# Patient Record
Sex: Female | Born: 1935 | Race: White | Hispanic: No | Marital: Married | Smoking: Never smoker
Health system: Southern US, Community
[De-identification: ages and names within clinical notes are randomized; demographics above are authoritative.]

## PROBLEM LIST (undated history)

## (undated) DIAGNOSIS — E119 Type 2 diabetes mellitus without complications: Secondary | ICD-10-CM

## (undated) DIAGNOSIS — I2510 Atherosclerotic heart disease of native coronary artery without angina pectoris: Secondary | ICD-10-CM

## (undated) DIAGNOSIS — F0390 Unspecified dementia without behavioral disturbance: Secondary | ICD-10-CM

## (undated) DIAGNOSIS — I10 Essential (primary) hypertension: Secondary | ICD-10-CM

## (undated) DIAGNOSIS — I6529 Occlusion and stenosis of unspecified carotid artery: Secondary | ICD-10-CM

## (undated) DIAGNOSIS — E785 Hyperlipidemia, unspecified: Secondary | ICD-10-CM

## (undated) HISTORY — PX: APPENDECTOMY: SHX54

## (undated) HISTORY — PX: CHOLECYSTECTOMY: SHX55

## (undated) HISTORY — DX: Type 2 diabetes mellitus without complications: E11.9

## (undated) HISTORY — DX: Hyperlipidemia, unspecified: E78.5

## (undated) HISTORY — PX: OTHER SURGICAL HISTORY: SHX169

## (undated) HISTORY — DX: Occlusion and stenosis of unspecified carotid artery: I65.29

## (undated) HISTORY — DX: Atherosclerotic heart disease of native coronary artery without angina pectoris: I25.10

## (undated) HISTORY — DX: Essential (primary) hypertension: I10

---

## 2003-12-29 ENCOUNTER — Inpatient Hospital Stay (HOSPITAL_BASED_OUTPATIENT_CLINIC_OR_DEPARTMENT_OTHER): Admission: RE | Admit: 2003-12-29 | Discharge: 2003-12-29 | Payer: Self-pay | Admitting: *Deleted

## 2011-12-30 ENCOUNTER — Other Ambulatory Visit: Payer: Self-pay | Admitting: Internal Medicine

## 2011-12-31 DIAGNOSIS — R55 Syncope and collapse: Secondary | ICD-10-CM

## 2012-01-07 ENCOUNTER — Telehealth: Payer: Self-pay | Admitting: Cardiology

## 2012-01-07 ENCOUNTER — Other Ambulatory Visit: Payer: Self-pay | Admitting: *Deleted

## 2012-01-07 DIAGNOSIS — R55 Syncope and collapse: Secondary | ICD-10-CM

## 2012-01-07 NOTE — Telephone Encounter (Signed)
Called patient to verify address/insurance information.

## 2012-01-07 NOTE — Telephone Encounter (Signed)
Spoke with patient and she was just recently at [facility] for syncope.  Per patient and follow up appointment patient needs event monitor.  Called the [place] office and they will arrange for monitor to be sent to patient, will forward to Carlye Grippe LPN for this to be done. Will also forward to Dr Antoine Poche and Elita Quick RN for review.  Advised patient if she doesn't receive monitor within the next few days or has another episode to call back.

## 2012-01-07 NOTE — Telephone Encounter (Signed)
New msg Pt said she passed out yesterday. She wanted to discuss. She has no chest pain or sob today

## 2012-01-09 DIAGNOSIS — R55 Syncope and collapse: Secondary | ICD-10-CM

## 2012-01-14 ENCOUNTER — Telehealth: Payer: Self-pay | Admitting: *Deleted

## 2012-01-14 MED ORDER — METOPROLOL SUCCINATE ER 25 MG PO TB24: 25.0000 mg | ORAL_TABLET | Freq: Every day | ORAL | Status: DC

## 2012-01-14 NOTE — Telephone Encounter (Signed)
Spoke with patient and informed her of the new medication that will be called in due to elevated HR episodes showing on heart monitor. Patient denies having any symptoms. Nurse advised patient to call office if she develops any symptoms. Patient verbalized understanding of plan.

## 2012-01-16 ENCOUNTER — Encounter: Payer: Self-pay | Admitting: Cardiology

## 2012-01-29 ENCOUNTER — Ambulatory Visit (INDEPENDENT_AMBULATORY_CARE_PROVIDER_SITE_OTHER): Payer: Medicare Other | Admitting: Cardiology

## 2012-01-29 ENCOUNTER — Encounter: Payer: Self-pay | Admitting: Cardiology

## 2012-01-29 VITALS — BP 116/69 | HR 79 | Ht 63.0 in | Wt 150.8 lb

## 2012-01-29 DIAGNOSIS — I6529 Occlusion and stenosis of unspecified carotid artery: Secondary | ICD-10-CM

## 2012-01-29 MED ORDER — RIVAROXABAN 20 MG PO TABS: 20.0000 mg | ORAL_TABLET | Freq: Every day | ORAL | Status: DC

## 2012-01-29 NOTE — Progress Notes (Signed)
HPI The patient presents for followup after a hospitalization for near syncope. At the time of that hospitalization she was found to have some bradycardia with heart rates in the 50s. She did have an echocardiogram which demonstrated normal left ventricular function. There were no valvular abnormalities. Since discharge she has worn an event monitor. This demonstrated paroxysmal atrial flutter/fibrillation. She was started on metoprolol. However, there were no bradycardic events other than some nighttime mild sinus bradycardia and rare episodes of Mobitz type I.  Of note the patient continues to feel palpitations. She says the beta blocker helped and she has few palpitations in the morning but by the afternoon they return. She really describes dizzy episodes. She's not describing orthostasis. She's had a fall where she lost her footing but she's not had any presyncope or syncope. She denies any chest pressure, neck or arm discomfort. She's had no new shortness of breath, PND or orthopnea. She has had no weight gain or edema. Of note she's only been taking her beta blocker once each morning.  Allergies  Allergen Reactions  . Amoxicillin   . Penicillins     Current Outpatient Prescriptions  Medication Sig Dispense Refill  . enalapril (VASOTEC) 20 MG tablet Take 20 mg by mouth daily.      Marland Kitchen gabapentin (NEURONTIN) 300 MG capsule Take 300 mg by mouth 2 (two) times daily.       . hydrochlorothiazide (MICROZIDE) 12.5 MG capsule Take 12.5 mg by mouth daily.      . metFORMIN (GLUCOPHAGE) 500 MG tablet Take 500 mg by mouth 2 (two) times daily with a meal.       . metoprolol tartrate (LOPRESSOR) 25 MG tablet Take 25 mg by mouth 2 (two) times daily.      Marland Kitchen omeprazole (PRILOSEC) 20 MG capsule Take 20 mg by mouth daily.      . pravastatin (PRAVACHOL) 40 MG tablet Take 40 mg by mouth daily.        Past Medical History  Diagnosis Date  . Diabetes mellitus   . HTN (hypertension)   . HLD (hyperlipidemia)    . CAD (coronary artery disease)     nonobsturctive. Less than 50% bilateral ICA stenosis, 2008.    Past Surgical History  Procedure Date  . Appendectomy   . Cholecystectomy   . Partial hysterectomy --- unknown     ROS:  As stated in the HPI and negative for all other systems.  PHYSICAL EXAM BP 116/69  Pulse 79  Ht 5\' 3"  (1.6 m)  Wt 150 lb 12.8 oz (68.402 kg)  BMI 26.71 kg/m2 GENERAL:  Well appearing HEENT:  Pupils equal round and reactive, fundi not visualized, oral mucosa unremarkable, dentures NECK:  No jugular venous distention, waveform within normal limits, carotid upstroke brisk and symmetric, no bruits, no thyromegaly LYMPHATICS:  No cervical, inguinal adenopathy LUNGS:  Clear to auscultation bilaterally BACK:  No CVA tenderness CHEST:  Unremarkable HEART:  PMI not displaced or sustained,S1 and S2 within normal limits, no S3, no S4, no clicks, no rubs, no murmurs ABD:  Flat, positive bowel sounds normal in frequency in pitch, no bruits, no rebound, no guarding, no midline pulsatile mass, no hepatomegaly, no splenomegaly EXT:  2 plus pulses throughout, no edema, no cyanosis no clubbing SKIN:  No rashes no nodules NEURO:  Cranial nerves II through XII grossly intact, motor grossly intact throughout PSYCH:  Cognitively intact, oriented to person place and time  ASSESSMENT AND PLAN  Atrial flutter/fibrillation -  I have carefully reviewed her rhythm strips and agree that there is evidence for flutter and fibrillation.  Ms. IOLANI TWILLEY has a CHA2DS2 - VASc score of 6 with a risk of stroke of 9.8%.  Given his anticoagulation is indicated. She would be a reasonable candidate for Xarelto.  We discussed at length risk of bleeding.  Dizziness This may be related to the arrhythmia as above. If I find that she's having symptoms it might be related to tachybradycardia syndrome I might switch to pindolol. For now am going to tell her to take her metoprolol twice a day as was  prescribed.  Carotid stenosis This is mild and will be followed up next year with carotid Dopplers.

## 2012-01-29 NOTE — Patient Instructions (Addendum)
   Begin Xarelto 20mg  every evening with meal  Stop monitor Follow up in  2 months

## 2012-02-01 ENCOUNTER — Telehealth: Payer: Self-pay | Admitting: *Deleted

## 2012-02-01 NOTE — Telephone Encounter (Signed)
OK to switch to warfarin.  Please clarify in the chart if this is going to be followed by her primary provider.  If not arrange follow up in our warfarin clinic.  If in primary care office arrange follow up in that office.

## 2012-02-01 NOTE — Telephone Encounter (Signed)
Message left on voice mail - wants to change to coumadin   Returned call - patient states that she would like to switch to Coumadin as the Xarelto is too expensive.  Explained to her about monitoring blood levels (INR) & establishing here in our coumadin clinic.  States she does have OV with [facility]D Virgina Organ) on Monday, 8/12 & will also discuss with him.  Patient does have a $30.00 co-pay here, but may be cheaper at [facility].  Informed patient will forward this info to Dr. Antoine Poche.  She verbalized understanding.

## 2012-02-06 ENCOUNTER — Other Ambulatory Visit: Payer: Self-pay | Admitting: *Deleted

## 2012-02-06 MED ORDER — METOPROLOL TARTRATE 25 MG PO TABS: 25.0000 mg | ORAL_TABLET | Freq: Two times a day (BID) | ORAL | Status: DC

## 2012-02-06 NOTE — Telephone Encounter (Signed)
Spoke with patient this morning and she states that Dr. Marilu Favre wants her to start the xarelto and she didn't need coumadin rx now and was going to pick up the xarelto.

## 2012-02-13 ENCOUNTER — Telehealth: Payer: Self-pay | Admitting: *Deleted

## 2012-02-13 NOTE — Telephone Encounter (Signed)
Patient called to say that xarelto caused her severe diarrhea and took her last dose on this past Sunday 02/10/12. Patient saw Dr. Virgina Organ on Monday and he started her on Coumadin 5 mg daily. Patient also stated that she has been shaking a lot and did inform PCP about this. Nurse advised her if the shaking continued, she should contact PCP back about this. Patient verbalized understanding.

## 2012-03-31 ENCOUNTER — Ambulatory Visit: Payer: Medicare Other | Admitting: Cardiology

## 2012-06-23 ENCOUNTER — Ambulatory Visit: Payer: Medicare Other | Admitting: Cardiology

## 2015-08-08 DIAGNOSIS — E119 Type 2 diabetes mellitus without complications: Secondary | ICD-10-CM | POA: Diagnosis not present

## 2015-10-17 DIAGNOSIS — I10 Essential (primary) hypertension: Secondary | ICD-10-CM | POA: Diagnosis not present

## 2015-10-17 DIAGNOSIS — R9431 Abnormal electrocardiogram [ECG] [EKG]: Secondary | ICD-10-CM | POA: Diagnosis not present

## 2015-10-27 DIAGNOSIS — E119 Type 2 diabetes mellitus without complications: Secondary | ICD-10-CM | POA: Diagnosis not present

## 2015-11-23 DIAGNOSIS — H538 Other visual disturbances: Secondary | ICD-10-CM | POA: Diagnosis not present

## 2015-11-23 DIAGNOSIS — H2511 Age-related nuclear cataract, right eye: Secondary | ICD-10-CM | POA: Diagnosis not present

## 2015-12-19 DIAGNOSIS — H2511 Age-related nuclear cataract, right eye: Secondary | ICD-10-CM | POA: Diagnosis not present

## 2015-12-19 DIAGNOSIS — I10 Essential (primary) hypertension: Secondary | ICD-10-CM | POA: Diagnosis not present

## 2015-12-19 DIAGNOSIS — H348122 Central retinal vein occlusion, left eye, stable: Secondary | ICD-10-CM | POA: Diagnosis not present

## 2015-12-19 DIAGNOSIS — Z961 Presence of intraocular lens: Secondary | ICD-10-CM | POA: Diagnosis not present

## 2015-12-19 DIAGNOSIS — E119 Type 2 diabetes mellitus without complications: Secondary | ICD-10-CM | POA: Diagnosis not present

## 2015-12-19 DIAGNOSIS — H269 Unspecified cataract: Secondary | ICD-10-CM | POA: Diagnosis not present

## 2015-12-19 DIAGNOSIS — Z79899 Other long term (current) drug therapy: Secondary | ICD-10-CM | POA: Diagnosis not present

## 2015-12-19 DIAGNOSIS — H538 Other visual disturbances: Secondary | ICD-10-CM | POA: Diagnosis not present

## 2015-12-19 DIAGNOSIS — H524 Presbyopia: Secondary | ICD-10-CM | POA: Diagnosis not present

## 2015-12-19 DIAGNOSIS — H5200 Hypermetropia, unspecified eye: Secondary | ICD-10-CM | POA: Diagnosis not present

## 2015-12-19 DIAGNOSIS — Z7984 Long term (current) use of oral hypoglycemic drugs: Secondary | ICD-10-CM | POA: Diagnosis not present

## 2015-12-19 DIAGNOSIS — H52209 Unspecified astigmatism, unspecified eye: Secondary | ICD-10-CM | POA: Diagnosis not present

## 2016-01-11 ENCOUNTER — Encounter: Payer: Self-pay | Admitting: Physician Assistant

## 2016-01-11 DIAGNOSIS — I10 Essential (primary) hypertension: Secondary | ICD-10-CM | POA: Diagnosis not present

## 2016-01-11 DIAGNOSIS — E7800 Pure hypercholesterolemia, unspecified: Secondary | ICD-10-CM | POA: Diagnosis not present

## 2016-01-11 DIAGNOSIS — E782 Mixed hyperlipidemia: Secondary | ICD-10-CM | POA: Diagnosis not present

## 2016-01-11 DIAGNOSIS — E119 Type 2 diabetes mellitus without complications: Secondary | ICD-10-CM | POA: Diagnosis not present

## 2016-01-11 DIAGNOSIS — E039 Hypothyroidism, unspecified: Secondary | ICD-10-CM | POA: Diagnosis not present

## 2016-03-06 DIAGNOSIS — E119 Type 2 diabetes mellitus without complications: Secondary | ICD-10-CM | POA: Diagnosis not present

## 2016-03-28 DIAGNOSIS — Z961 Presence of intraocular lens: Secondary | ICD-10-CM | POA: Diagnosis not present

## 2016-04-16 DIAGNOSIS — I10 Essential (primary) hypertension: Secondary | ICD-10-CM | POA: Diagnosis not present

## 2016-04-16 DIAGNOSIS — E119 Type 2 diabetes mellitus without complications: Secondary | ICD-10-CM | POA: Diagnosis not present

## 2016-07-03 DIAGNOSIS — E119 Type 2 diabetes mellitus without complications: Secondary | ICD-10-CM | POA: Diagnosis not present

## 2016-07-11 ENCOUNTER — Encounter: Payer: Self-pay | Admitting: Physician Assistant

## 2016-07-17 ENCOUNTER — Ambulatory Visit: Payer: Self-pay | Admitting: Physician Assistant

## 2016-07-18 ENCOUNTER — Ambulatory Visit (INDEPENDENT_AMBULATORY_CARE_PROVIDER_SITE_OTHER): Payer: Medicare Other | Admitting: Physician Assistant

## 2016-07-18 ENCOUNTER — Encounter: Payer: Self-pay | Admitting: Physician Assistant

## 2016-07-18 VITALS — BP 120/63 | HR 60 | Temp 96.9°F | Ht 63.0 in | Wt 138.8 lb

## 2016-07-18 DIAGNOSIS — I10 Essential (primary) hypertension: Secondary | ICD-10-CM

## 2016-07-18 DIAGNOSIS — E119 Type 2 diabetes mellitus without complications: Secondary | ICD-10-CM | POA: Diagnosis not present

## 2016-07-18 DIAGNOSIS — N3281 Overactive bladder: Secondary | ICD-10-CM | POA: Diagnosis not present

## 2016-07-18 DIAGNOSIS — E7800 Pure hypercholesterolemia, unspecified: Secondary | ICD-10-CM | POA: Diagnosis not present

## 2016-07-18 DIAGNOSIS — G629 Polyneuropathy, unspecified: Secondary | ICD-10-CM

## 2016-07-18 DIAGNOSIS — Z8679 Personal history of other diseases of the circulatory system: Secondary | ICD-10-CM

## 2016-07-18 DIAGNOSIS — E559 Vitamin D deficiency, unspecified: Secondary | ICD-10-CM

## 2016-07-18 LAB — BAYER DCA HB A1C WAIVED: HB A1C (BAYER DCA - WAIVED): 7.1 % — ABNORMAL HIGH (ref <7.0)

## 2016-07-18 MED ORDER — ATORVASTATIN CALCIUM 10 MG PO TABS: 10.0000 mg | ORAL_TABLET | Freq: Every day | ORAL | 3 refills | Status: DC

## 2016-07-18 MED ORDER — DILTIAZEM HCL 30 MG PO TABS: 30.0000 mg | ORAL_TABLET | Freq: Two times a day (BID) | ORAL | 3 refills | Status: DC

## 2016-07-18 MED ORDER — VITAMIN D (ERGOCALCIFEROL) 1.25 MG (50000 UNIT) PO CAPS: 50000.0000 [IU] | ORAL_CAPSULE | ORAL | 3 refills | Status: DC

## 2016-07-18 MED ORDER — METFORMIN HCL 500 MG PO TABS: 500.0000 mg | ORAL_TABLET | Freq: Two times a day (BID) | ORAL | 3 refills | Status: DC

## 2016-07-18 MED ORDER — GABAPENTIN 300 MG PO CAPS: 300.0000 mg | ORAL_CAPSULE | Freq: Every day | ORAL | 3 refills | Status: DC

## 2016-07-18 MED ORDER — ENALAPRIL MALEATE 20 MG PO TABS: 20.0000 mg | ORAL_TABLET | Freq: Two times a day (BID) | ORAL | 3 refills | Status: DC

## 2016-07-18 MED ORDER — OXYBUTYNIN CHLORIDE ER 5 MG PO TB24
5.0000 mg | ORAL_TABLET | Freq: Every day | ORAL | 1 refills | Status: DC
Start: 2016-07-18 — End: 2016-07-19

## 2016-07-18 NOTE — Patient Instructions (Signed)
Overactive Bladder, Adult Introduction Overactive bladder is a group of urinary symptoms. With overactive bladder, you may suddenly feel the need to pass urine (urinate) right away. After feeling this sudden urge, you might also leak urine if you cannot get to the bathroom fast enough (urinary incontinence). These symptoms might interfere with your daily work or social activities. Overactive bladder symptoms may also wake you up at night. Overactive bladder affects the nerve signals between your bladder and your brain. Your bladder may get the signal to empty before it is full. Very sensitive muscles can also make your bladder squeeze too soon. What are the causes? Many things can cause an overactive bladder. Possible causes include:  Urinary tract infection.  Infection of nearby tissues, such as the prostate.  Prostate enlargement.  Being pregnant with twins or more (multiples).  Surgery on the uterus or urethra.  Bladder stones, inflammation, or tumors.  Drinking too much caffeine or alcohol.  Certain medicines, especially those that you take to help your body get rid of extra fluid (diuretics) by increasing urine production.  Muscle or nerve weakness, especially from:  A spinal cord injury.  Stroke.  Multiple sclerosis.  Parkinson disease.  Diabetes. This can cause a high urine volume that fills the bladder so quickly that the normal urge to urinate is triggered very strongly.  Constipation. A buildup of too much stool can put pressure on your bladder. What increases the risk? You may be at greater risk for overactive bladder if you:  Are an older adult.  Smoke.  Are going through menopause.  Have prostate problems.  Have a neurological disease, such as stroke, dementia, Parkinson disease, or multiple sclerosis (MS).  Eat or drink things that irritate the bladder. These include alcohol, spicy food, and caffeine.  Are overweight or obese. What are the signs or  symptoms? The signs and symptoms of an overactive bladder include:  Sudden, strong urges to urinate.  Leaking urine.  Urinating eight or more times per day.  Waking up to urinate two or more times per night. How is this diagnosed? Your health care provider may suspect overactive bladder based on your symptoms. The health care provider will do a physical exam and take your medical history. Blood or urine tests may also be done. For example, you might need to have a bladder function test to check how well you can hold your urine. You might also need to see a health care provider who specializes in the urinary tract (urologist). How is this treated? Treatment for overactive bladder depends on the cause of your condition and whether it is mild or severe. Certain treatments can be done in your health care provider's office or clinic. You can also make lifestyle changes at home. Options include: Behavioral Treatments  Biofeedback. A specialist uses sensors to help you become aware of your body's signals.  Keeping a daily log of when you need to urinate and what happens after the urge. This may help you manage your condition.  Bladder training. This helps you learn to control the urge to urinate by following a schedule that directs you to urinate at regular intervals (timed voiding). At first, you might have to wait a few minutes after feeling the urge. In time, you should be able to schedule bathroom visits an hour or more apart.  Kegel exercises. These are exercises to strengthen the pelvic floor muscles, which support the bladder. Toning these muscles can help you control urination, even if your bladder muscles   are overactive. A specialist will teach you how to do these exercises correctly. They require daily practice.  Weight loss. If you are obese or overweight, losing weight might relieve your symptoms of overactive bladder. Talk to your health care provider about losing weight and whether  there is a specific program or method that would work best for you.  Diet change. This might help if constipation is making your overactive bladder worse. Your health care provider or a dietitian can explain ways to change what you eat to ease constipation. You might also need to consume less alcohol and caffeine or drink other fluids at different times of the day.  Stopping smoking.  Wearing pads to absorb leakage while you wait for other treatments to take effect. Physical Treatments  Electrical stimulation. Electrodes send gentle pulses of electricity to strengthen the nerves or muscles that help to control the bladder. Sometimes, the electrodes are placed outside of the body. In other cases, they might be placed inside the body (implanted). This treatment can take several months to have an effect.  Supportive devices. Women may need a plastic device that fits into the vagina and supports the bladder (pessary). Medicines  Several medicines can help treat overactive bladder and are usually used along with other treatments. Some are injected into the muscles involved in urination. Others come in pill form. Your health care provider may prescribe:  Antispasmodics. These medicines block the signals that the nerves send to the bladder. This keeps the bladder from releasing urine at the wrong time.  Tricyclic antidepressants. These types of antidepressants also relax bladder muscles. Surgery  You may have a device implanted to help manage the nerve signals that indicate when you need to urinate.  You may have surgery to implant electrodes for electrical stimulation.  Sometimes, very severe cases of overactive bladder require surgery to change the shape of the bladder. Follow these instructions at home:  Take medicines only as directed by your health care provider.  Use any implants or a pessary as directed by your health care provider.  Make any diet or lifestyle changes that are  recommended by your health care provider. These might include:  Drinking less fluid or drinking at different times of the day. If you need to urinate often during the night, you may need to stop drinking fluids early in the evening.  Cutting down on caffeine or alcohol. Both can make an overactive bladder worse. Caffeine is found in coffee, tea, and sodas.  Doing Kegel exercises to strengthen muscles.  Losing weight if you need to.  Eating a healthy and balanced diet to prevent constipation.  Keep a journal or log to track how much and when you drink and also when you feel the need to urinate. This will help your health care provider to monitor your condition. Contact a health care provider if:  Your symptoms do not get better after treatment.  Your pain and discomfort are getting worse.  You have more frequent urges to urinate.  You have a fever. Get help right away if: You are not able to control your bladder at all. This information is not intended to replace advice given to you by your health care provider. Make sure you discuss any questions you have with your health care provider. Document Released: 04/07/2009 Document Revised: 11/17/2015 Document Reviewed: 11/04/2013  2017 Elsevier  

## 2016-07-19 ENCOUNTER — Telehealth: Payer: Self-pay

## 2016-07-19 LAB — VITAMIN D 25 HYDROXY (VIT D DEFICIENCY, FRACTURES): Vit D, 25-Hydroxy: 51.6 ng/mL (ref 30.0–100.0)

## 2016-07-19 LAB — CMP14+EGFR
ALT: 10 [IU]/L (ref 0–32)
AST: 12 [IU]/L (ref 0–40)
Albumin/Globulin Ratio: 1.9 (ref 1.2–2.2)
Albumin: 4.1 g/dL (ref 3.5–4.7)
Alkaline Phosphatase: 75 [IU]/L (ref 39–117)
BUN/Creatinine Ratio: 26 (ref 12–28)
BUN: 19 mg/dL (ref 8–27)
Bilirubin Total: 0.4 mg/dL (ref 0.0–1.2)
CO2: 24 mmol/L (ref 18–29)
Calcium: 9.1 mg/dL (ref 8.7–10.3)
Chloride: 104 mmol/L (ref 96–106)
Creatinine, Ser: 0.73 mg/dL (ref 0.57–1.00)
GFR calc Af Amer: 89 mL/min/{1.73_m2}
GFR calc non Af Amer: 77 mL/min/{1.73_m2}
Globulin, Total: 2.2 g/dL (ref 1.5–4.5)
Glucose: 88 mg/dL (ref 65–99)
Potassium: 4.1 mmol/L (ref 3.5–5.2)
Sodium: 143 mmol/L (ref 134–144)
Total Protein: 6.3 g/dL (ref 6.0–8.5)

## 2016-07-19 LAB — LIPID PANEL
Chol/HDL Ratio: 3 ratio (ref 0.0–4.4)
Cholesterol, Total: 157 mg/dL (ref 100–199)
HDL: 52 mg/dL
LDL Calculated: 76 mg/dL (ref 0–99)
Triglycerides: 146 mg/dL (ref 0–149)
VLDL Cholesterol Cal: 29 mg/dL (ref 5–40)

## 2016-07-19 MED ORDER — TOLTERODINE TARTRATE ER 2 MG PO CP24: 2.0000 mg | ORAL_CAPSULE | Freq: Every day | ORAL | 2 refills | Status: DC

## 2016-07-19 NOTE — Progress Notes (Signed)
Subjective:    Patient ID: Julie Riley, female    DOB: 04/07/1932, 83 y.o.   MRN: [id number] Patient here to be established as new patient at [facility].   Diabetes  She presents for her initial diabetic visit. She has type 2 diabetes mellitus. No MedicAlert identification noted. The initial diagnosis of diabetes was made 5 years ago. Her disease course has been stable. There are no hypoglycemic associated symptoms. Pertinent negatives for hypoglycemia include no dizziness. Pertinent negatives for diabetes include no blurred vision, no chest pain, no fatigue, no foot ulcerations, no visual change and no weakness. There are no hypoglycemic complications. Symptoms are stable. There are no diabetic complications. Risk factors for coronary artery disease include dyslipidemia and family history. Current diabetic treatment includes diet and oral agent (monotherapy). She is compliant with treatment all of the time. Her weight is stable. She is following a diabetic diet. Her breakfast blood glucose is taken between 7-8 am. Her breakfast blood glucose range is generally 90-110 mg/dl.  Hypertension  This is a chronic problem. The current episode started more than 1 year ago. The problem is unchanged. The problem is controlled. Pertinent negatives include no anxiety, blurred vision, chest pain or peripheral edema. There are no associated agents to hypertension. There are no known risk factors for coronary artery disease. Past treatments include nothing. The current treatment provides significant improvement. There are no compliance problems.  There is no history of angina, kidney disease, CAD/MI or heart failure.  Atrial Fibrillation  Presents for follow-up visit. Symptoms include hypertension. Symptoms are negative for chest pain, dizziness and weakness. The symptoms have been stable. Past medical history includes atrial fibrillation and hyperlipidemia. There are no medication  compliance problems.  Hyperlipidemia  This is a chronic problem. The current episode started more than 1 year ago. The problem is controlled. Recent lipid tests were reviewed and are normal. Exacerbating diseases include diabetes. Pertinent negatives include no chest pain. Current antihyperlipidemic treatment includes statins. The current treatment provides significant improvement of lipids. There are no compliance problems.  Risk factors for coronary artery disease include diabetes mellitus, dyslipidemia and family history.   Past Medical History:  Diagnosis Date  . CAD (coronary artery disease)    nonobsturctive.   . Carotid stenosis    Less than 50% bilateral ICA stenosis, 2008.  . Diabetes mellitus ([place])   . HLD (hyperlipidemia)   . HTN (hypertension)    Family History  Problem Relation Age of Onset  . Heart attack Father     cause of death   Social History   Social History  . Marital status: Married    Spouse name: N/A  . Number of children: N/A  . Years of education: N/A   Social History Main Topics  . Smoking status: Never Smoker  . Smokeless tobacco: Never Used  . Alcohol use No  . Drug use: No  . Sexual activity: Not Asked   Other Topics Concern  . None   Social History Narrative  . None   Past Surgical History:  Procedure Laterality Date  . APPENDECTOMY    . CHOLECYSTECTOMY    . partial hysterectomy --- unknown     No current outpatient prescriptions on file prior to visit.   No current facility-administered medications on file prior to visit.      Review of Systems  Constitutional: Negative.  Negative for activity change, fatigue and fever.  HENT: Negative.   Eyes: Negative.  Negative for  blurred vision.  Respiratory: Negative.  Negative for cough.   Cardiovascular: Negative.  Negative for chest pain.  Gastrointestinal: Negative.  Negative for abdominal pain.  Endocrine: Negative.   Genitourinary: Positive for difficulty urinating and frequency.  Negative for dyspareunia, dysuria, flank pain, hematuria and pelvic pain.  Musculoskeletal: Negative.   Skin: Negative.   Neurological: Negative.  Negative for dizziness and weakness.   BP 120/63   Pulse 60   Temp (!) 96.9 F (36.1 C) (Oral)   Ht _0  (1.6 m)   Wt 138 lb 12.8 oz (63 kg)   BMI 24.59 kg/m      Objective:   Physical Exam  Constitutional: She is oriented to person, place, and time. She appears well-developed and well-nourished.  HENT:  Head: Normocephalic and atraumatic.  Right Ear: Tympanic membrane, external ear and ear canal normal.  Left Ear: Tympanic membrane, external ear and ear canal normal.  Nose: Nose normal. No rhinorrhea.  Mouth/Throat: Oropharynx is clear and moist and mucous membranes are normal. No oropharyngeal exudate or posterior oropharyngeal erythema.  Eyes: Conjunctivae and EOM are normal. Pupils are equal, round, and reactive to light.  Neck: Normal range of motion. Neck supple.  Cardiovascular: Normal rate, regular rhythm, normal heart sounds and intact distal pulses.   Pulmonary/Chest: Effort normal and breath sounds normal.  Abdominal: Soft. Bowel sounds are normal. She exhibits no distension. There is no tenderness.  Neurological: She is alert and oriented to person, place, and time. She has normal reflexes.  Skin: Skin is warm and dry. No rash noted.  Psychiatric: She has a normal mood and affect. Her behavior is normal. Judgment and thought content normal.          Assessment & Plan:  1. Essential hypertension - enalapril (VASOTEC) 20 MG tablet; Take 1 tablet (20 mg total) by mouth 2 (two) times daily.  Dispense: 180 tablet; Refill: 3 - diltiazem (CARDIZEM) 30 MG tablet; Take 1 tablet (30 mg total) by mouth 2 (two) times daily.  Dispense: 180 tablet; Refill: 3  2. History of atrial fibrillation - aspirin 325 MG EC tablet; Take 325 mg by mouth daily. - diltiazem (CARDIZEM) 30 MG tablet; Take 1 tablet (30 mg total) by mouth 2 (two)  times daily.  Dispense: 180 tablet; Refill: 3  3. Type 2 diabetes mellitus without complication, without long-term current use of insulin (HCC) - metFORMIN (GLUCOPHAGE) 500 MG tablet; Take 1 tablet (500 mg total) by mouth 2 (two) times daily with a meal.  Dispense: 180 tablet; Refill: 3 - CMP14+EGFR - POCT glycosylated hemoglobin (Hb A1C) - Lipid panel - Bayer DCA Hb A1c Waived  4. Neuropathy (HCC) - gabapentin (NEURONTIN) 300 MG capsule; Take 1 capsule (300 mg total) by mouth daily.  Dispense: 90 capsule; Refill: 3  5. Vitamin D deficiency - Vitamin D, Ergocalciferol, (DRISDOL) 50000 units CAPS capsule; Take 1 capsule (50,000 Units total) by mouth every 7 (seven) days.  Dispense: 90 capsule; Refill: 3 - VITAMIN D 25 Hydroxy (Vit-D Deficiency, Fractures)  6. OAB (overactive bladder) detrol LA 2 mg one daily  7. Pure hypercholesterolemia - atorvastatin (LIPITOR) 10 MG tablet; Take 1 tablet (10 mg total) by mouth daily.  Dispense: 90 tablet; Refill: 3 - Lipid panel  An After Visit Summary was printed and given to the patient.  Follow up 3 months

## 2016-07-19 NOTE — Telephone Encounter (Signed)
Prescription sent to pharmacy.

## 2016-10-16 ENCOUNTER — Encounter: Payer: Self-pay | Admitting: Physician Assistant

## 2016-10-16 ENCOUNTER — Ambulatory Visit (INDEPENDENT_AMBULATORY_CARE_PROVIDER_SITE_OTHER): Payer: Medicare Other | Admitting: Physician Assistant

## 2016-10-16 VITALS — BP 138/74 | HR 64 | Temp 97.4°F | Ht 63.0 in | Wt 134.2 lb

## 2016-10-16 DIAGNOSIS — N3281 Overactive bladder: Secondary | ICD-10-CM

## 2016-10-16 DIAGNOSIS — E119 Type 2 diabetes mellitus without complications: Secondary | ICD-10-CM

## 2016-10-16 LAB — BAYER DCA HB A1C WAIVED: HB A1C (BAYER DCA - WAIVED): 7.1 % — ABNORMAL HIGH (ref <7.0)

## 2016-10-16 MED ORDER — VITAMIN D-3 125 MCG (5000 UT) PO TABS: 1.0000 | ORAL_TABLET | Freq: Every day | ORAL | Status: DC

## 2016-10-16 MED ORDER — TOLTERODINE TARTRATE ER 4 MG PO CP24: 4.0000 mg | ORAL_CAPSULE | Freq: Every day | ORAL | 3 refills | Status: DC

## 2016-10-17 NOTE — Progress Notes (Signed)
BP 138/74   Pulse 64   Temp 97.4 F (36.3 C) (Oral)   Ht  (1.6 m)   Wt 134 lb 3.2 oz (60.9 kg)   BMI 23.77 kg/m    Subjective:    Patient ID: Julie Riley, female    DOB: 1932/05/03, 83 y.o.   MRN: [id number]  HPI: Julie Riley is a 83 y.o. female presenting on 10/16/2016 for Follow-up (3 month )  This patient comes in for periodic recheck on medications and conditions including Diabetes, overactive bladder, neuralgia, hypertension. Overall the patient is doing well. She states that the Detrol 2 mg has not made a huge difference in her overactive bladder symptoms but has helped somewhat. She is willing to try the 4 mg dose. We will send this to the pharmacy. Her last A1c was 7.1. That is good control for her. It is very likely she will not have hypoglycemic events. We will recheck an A1c today.   All medications are reviewed today. There are no reports of any problems with the medications. All of the medical conditions are reviewed and updated.  Lab work is reviewed and will be ordered as medically necessary. There are no new problems reported with today's visit.   Relevant past medical, surgical, family and social history reviewed and updated as indicated. Allergies and medications reviewed and updated.  Past Medical History:  Diagnosis Date  . CAD (coronary artery disease)    nonobsturctive.   . Carotid stenosis    Less than 50% bilateral ICA stenosis, 2008.  . Diabetes mellitus (HCC)   . HLD (hyperlipidemia)   . HTN (hypertension)     Past Surgical History:  Procedure Laterality Date  . APPENDECTOMY    . CHOLECYSTECTOMY    . partial hysterectomy --- unknown      Review of Systems  Constitutional: Negative.  Negative for activity change, fatigue and fever.  HENT: Negative.   Eyes: Negative.   Respiratory: Negative.  Negative for cough.   Cardiovascular: Negative.  Negative for chest pain.  Gastrointestinal: Negative.  Negative for abdominal pain.    Endocrine: Negative.   Genitourinary: Positive for frequency. Negative for dysuria, flank pain and genital sores.  Musculoskeletal: Negative.   Skin: Negative.   Neurological: Negative.     Allergies as of 10/16/2016      Reactions   Amoxicillin    Penicillins    Xarelto [rivaroxaban] Other (See Comments)   Severe diarrhea      Medication List       Accurate as of 10/16/16 11:59 PM. Always use your most recent med list.          aspirin 325 MG EC tablet Take 325 mg by mouth daily.   atorvastatin 10 MG tablet Commonly known as:  LIPITOR Take 1 tablet (10 mg total) by mouth daily.   diltiazem 30 MG tablet Commonly known as:  CARDIZEM Take 1 tablet (30 mg total) by mouth 2 (two) times daily.   enalapril 20 MG tablet Commonly known as:  VASOTEC Take 1 tablet (20 mg total) by mouth 2 (two) times daily.   gabapentin 300 MG capsule Commonly known as:  NEURONTIN Take 1 capsule (300 mg total) by mouth daily.   metFORMIN 500 MG tablet Commonly known as:  GLUCOPHAGE Take 1 tablet (500 mg total) by mouth 2 (two) times daily with a meal.   tolterodine 4 MG 24 hr capsule Commonly known as:  DETROL LA Take 1 capsule (4 mg  total) by mouth daily.   Vitamin D-3 5000 units Tabs Take 1 tablet by mouth daily.          Objective:    BP 138/74   Pulse 64   Temp 97.4 F (36.3 C) (Oral)   Ht  (1.6 m)   Wt 134 lb 3.2 oz (60.9 kg)   BMI 23.77 kg/m   Allergies  Allergen Reactions  . Amoxicillin   . Penicillins   . Xarelto [Rivaroxaban] Other (See Comments)    Severe diarrhea    Physical Exam  Constitutional: She is oriented to person, place, and time. She appears well-developed and well-nourished.  HENT:  Head: Normocephalic and atraumatic.  Right Ear: Tympanic membrane, external ear and ear canal normal.  Left Ear: Tympanic membrane, external ear and ear canal normal.  Nose: Nose normal. No rhinorrhea.  Mouth/Throat: Oropharynx is clear and moist and mucous  membranes are normal. No oropharyngeal exudate or posterior oropharyngeal erythema.  Eyes: Conjunctivae and EOM are normal. Pupils are equal, round, and reactive to light.  Neck: Normal range of motion. Neck supple.  Cardiovascular: Normal rate, regular rhythm, normal heart sounds and intact distal pulses.   Pulmonary/Chest: Effort normal and breath sounds normal.  Abdominal: Soft. Bowel sounds are normal.  Neurological: She is alert and oriented to person, place, and time. She has normal reflexes.  Skin: Skin is warm and dry. No rash noted.  Psychiatric: She has a normal mood and affect. Her behavior is normal. Judgment and thought content normal.    Results for orders placed or performed in visit on 10/16/16  Bayer DCA Hb A1c Waived  Result Value Ref Range   Bayer DCA Hb A1c Waived 7.1 (H) <7.0 %      Assessment & Plan:   1. Type 2 diabetes mellitus without complication, without long-term current use of insulin (HCC) - Cholecalciferol (VITAMIN D-3) 5000 units TABS; Take 1 tablet by mouth daily.  Dispense: 30 tablet - Bayer DCA Hb A1c Waived  2. OAB (overactive bladder) - tolterodine (DETROL LA) 4 MG 24 hr capsule; Take 1 capsule (4 mg total) by mouth daily.  Dispense: 90 capsule; Refill: 3   Current Outpatient Prescriptions:  .  aspirin 325 MG EC tablet, Take 325 mg by mouth daily., Disp: , Rfl:  .  atorvastatin (LIPITOR) 10 MG tablet, Take 1 tablet (10 mg total) by mouth daily., Disp: 90 tablet, Rfl: 3 .  diltiazem (CARDIZEM) 30 MG tablet, Take 1 tablet (30 mg total) by mouth 2 (two) times daily., Disp: 180 tablet, Rfl: 3 .  enalapril (VASOTEC) 20 MG tablet, Take 1 tablet (20 mg total) by mouth 2 (two) times daily., Disp: 180 tablet, Rfl: 3 .  gabapentin (NEURONTIN) 300 MG capsule, Take 1 capsule (300 mg total) by mouth daily., Disp: 90 capsule, Rfl: 3 .  metFORMIN (GLUCOPHAGE) 500 MG tablet, Take 1 tablet (500 mg total) by mouth 2 (two) times daily with a meal., Disp: 180 tablet,  Rfl: 3 .  tolterodine (DETROL LA) 4 MG 24 hr capsule, Take 1 capsule (4 mg total) by mouth daily., Disp: 90 capsule, Rfl: 3 .  Cholecalciferol (VITAMIN D-3) 5000 units TABS, Take 1 tablet by mouth daily., Disp: 30 tablet, Rfl:   Continue all other maintenance medications as listed above.  Follow up plan: Return in about 3 months (around 01/15/2017).  Educational handout given for overactive bladder  Remus Loffler PA-C [facility] [address]  [place], [place]  [address] [phone]   10/17/2016, 9:12 AM

## 2016-12-19 ENCOUNTER — Telehealth: Payer: Self-pay | Admitting: Physician Assistant

## 2016-12-19 NOTE — Telephone Encounter (Signed)
rx already there, pt aware

## 2016-12-20 ENCOUNTER — Telehealth: Payer: Self-pay | Admitting: Physician Assistant

## 2016-12-24 NOTE — Telephone Encounter (Signed)
Pt to schedule eye exam

## 2017-01-03 DIAGNOSIS — H40033 Anatomical narrow angle, bilateral: Secondary | ICD-10-CM | POA: Diagnosis not present

## 2017-01-03 DIAGNOSIS — E119 Type 2 diabetes mellitus without complications: Secondary | ICD-10-CM | POA: Diagnosis not present

## 2017-01-15 ENCOUNTER — Ambulatory Visit (INDEPENDENT_AMBULATORY_CARE_PROVIDER_SITE_OTHER): Payer: Medicare Other | Admitting: Physician Assistant

## 2017-01-15 ENCOUNTER — Encounter: Payer: Self-pay | Admitting: Physician Assistant

## 2017-01-15 VITALS — BP 133/81 | HR 67 | Temp 97.2°F | Ht 63.0 in | Wt 136.2 lb

## 2017-01-15 DIAGNOSIS — N3281 Overactive bladder: Secondary | ICD-10-CM

## 2017-01-15 DIAGNOSIS — I10 Essential (primary) hypertension: Secondary | ICD-10-CM

## 2017-01-15 DIAGNOSIS — E119 Type 2 diabetes mellitus without complications: Secondary | ICD-10-CM | POA: Diagnosis not present

## 2017-01-15 NOTE — Patient Instructions (Signed)
In a few days you may receive a survey in the mail or online from [employer] regarding your visit with us today. Please take a moment to fill this out. Your feedback is very important to our whole office. It can help us better understand your needs as well as improve your experience and satisfaction. Thank you for taking your time to complete it. We care about you.  Sally Menard, PA-C  

## 2017-01-15 NOTE — Progress Notes (Signed)
BP 133/81   Pulse 67   Temp (!) 97.2 F (36.2 C) (Oral)   Ht '5\' 3"'  (1.6 m)   Wt 136 lb 3.2 oz (61.8 kg)   BMI 24.13 kg/m    Subjective:    Patient ID: Julie Riley, female    DOB: 11-05-1931, 83 y.o.   MRN: [id number]  HPI: Julie Riley is a 83 y.o. female presenting on 01/15/2017 for Follow-up (3 month ); Hypertension; and Diabetes  This patient comes in for periodic recheck on medications and conditions including type 2 diabetes well controlled, hypertension, overactive bladder. She has lost her daughter in the past couple months related to cancer. She seems good and strong today. There are no new complaints except her right shoulder will feel sore at times. She is able to have good range of motion and not dropping anything. She will just continue to watch this. The medication is helping her overactive bladder tremendously. She is happy to keep taking the Detrol.   All medications are reviewed today. There are no reports of any problems with the medications. All of the medical conditions are reviewed and updated.  Lab work is reviewed and will be ordered as medically necessary. There are no new problems reported with today's visit.   Relevant past medical, surgical, family and social history reviewed and updated as indicated. Allergies and medications reviewed and updated.  Past Medical History:  Diagnosis Date  . CAD (coronary artery disease)    nonobsturctive.   . Carotid stenosis    Less than 50% bilateral ICA stenosis, 2008.  . Diabetes mellitus ([place])   . HLD (hyperlipidemia)   . HTN (hypertension)     Past Surgical History:  Procedure Laterality Date  . APPENDECTOMY    . CHOLECYSTECTOMY    . partial hysterectomy --- unknown      Review of Systems  Constitutional: Negative.  Negative for activity change, fatigue and fever.  HENT: Negative.   Eyes: Negative.   Respiratory: Negative.  Negative for cough.   Cardiovascular: Negative.  Negative for chest pain.    Gastrointestinal: Negative.  Negative for abdominal pain.  Endocrine: Negative.   Genitourinary: Negative.  Negative for dysuria.  Musculoskeletal: Positive for arthralgias.  Skin: Negative.   Neurological: Negative.     Allergies as of 01/15/2017      Reactions   Amoxicillin    Penicillins    Xarelto [rivaroxaban] Other (See Comments)   Severe diarrhea      Medication List       Accurate as of 01/15/17  9:14 AM. Always use your most recent med list.          aspirin 325 MG EC tablet Take 325 mg by mouth daily.   atorvastatin 10 MG tablet Commonly known as:  LIPITOR Take 1 tablet (10 mg total) by mouth daily.   diltiazem 30 MG tablet Commonly known as:  CARDIZEM Take 1 tablet (30 mg total) by mouth 2 (two) times daily.   enalapril 20 MG tablet Commonly known as:  VASOTEC Take 1 tablet (20 mg total) by mouth 2 (two) times daily.   gabapentin 300 MG capsule Commonly known as:  NEURONTIN Take 1 capsule (300 mg total) by mouth daily.   metFORMIN 500 MG tablet Commonly known as:  GLUCOPHAGE Take 1 tablet (500 mg total) by mouth 2 (two) times daily with a meal.   tolterodine 4 MG 24 hr capsule Commonly known as:  DETROL LA Take 1 capsule (4  mg total) by mouth daily.   Vitamin D-3 5000 units Tabs Take 1 tablet by mouth daily.          Objective:    BP 133/81   Pulse 67   Temp (!) 97.2 F (36.2 C) (Oral)   Ht '5\' 3"'  (1.6 m)   Wt 136 lb 3.2 oz (61.8 kg)   BMI 24.13 kg/m   Allergies  Allergen Reactions  . Amoxicillin   . Penicillins   . Xarelto [Rivaroxaban] Other (See Comments)    Severe diarrhea    Physical Exam  Constitutional: She is oriented to person, place, and time. She appears well-developed and well-nourished.  HENT:  Head: Normocephalic and atraumatic.  Right Ear: Tympanic membrane, external ear and ear canal normal.  Left Ear: Tympanic membrane, external ear and ear canal normal.  Nose: Nose normal. No rhinorrhea.  Mouth/Throat:  Oropharynx is clear and moist and mucous membranes are normal. No oropharyngeal exudate or posterior oropharyngeal erythema.  Eyes: Pupils are equal, round, and reactive to light. Conjunctivae and EOM are normal.  Neck: Normal range of motion. Neck supple.  Cardiovascular: Normal rate, regular rhythm, normal heart sounds and intact distal pulses.   Pulmonary/Chest: Effort normal and breath sounds normal.  Abdominal: Soft. Bowel sounds are normal.  Neurological: She is alert and oriented to person, place, and time. She has normal reflexes.  Skin: Skin is warm and dry. No rash noted.  Psychiatric: She has a normal mood and affect. Her behavior is normal. Judgment and thought content normal.  Nursing note and vitals reviewed.   Results for orders placed or performed in visit on 10/16/16  Bayer DCA Hb A1c Waived  Result Value Ref Range   Bayer DCA Hb A1c Waived 7.1 (H) <7.0 %      Assessment & Plan:   1. OAB (overactive bladder)  2. Type 2 diabetes mellitus without complication, without long-term current use of insulin ([place])  3. Essential hypertension - CMP14+EGFR - Lipid panel - CBC with Differential/Platelet  4. Diabetes mellitus without complication ([place]) - Bayer DCA Hb A1c Waived   Current Outpatient Prescriptions:  .  aspirin 325 MG EC tablet, Take 325 mg by mouth daily., Disp: , Rfl:  .  atorvastatin (LIPITOR) 10 MG tablet, Take 1 tablet (10 mg total) by mouth daily., Disp: 90 tablet, Rfl: 3 .  Cholecalciferol (VITAMIN D-3) 5000 units TABS, Take 1 tablet by mouth daily., Disp: 30 tablet, Rfl:  .  diltiazem (CARDIZEM) 30 MG tablet, Take 1 tablet (30 mg total) by mouth 2 (two) times daily., Disp: 180 tablet, Rfl: 3 .  enalapril (VASOTEC) 20 MG tablet, Take 1 tablet (20 mg total) by mouth 2 (two) times daily., Disp: 180 tablet, Rfl: 3 .  gabapentin (NEURONTIN) 300 MG capsule, Take 1 capsule (300 mg total) by mouth daily., Disp: 90 capsule, Rfl: 3 .  metFORMIN (GLUCOPHAGE) 500  MG tablet, Take 1 tablet (500 mg total) by mouth 2 (two) times daily with a meal., Disp: 180 tablet, Rfl: 3 .  tolterodine (DETROL LA) 4 MG 24 hr capsule, Take 1 capsule (4 mg total) by mouth daily., Disp: 90 capsule, Rfl: 3  Continue all other maintenance medications as listed above.  Follow up plan: Return in about 6 months (around 07/18/2017) for recheck.  Educational handout given for [place] PA-C [facility] [address]  [place], [place] [address] [phone]   01/15/2017, 9:14 AM

## 2017-01-16 LAB — CBC WITH DIFFERENTIAL/PLATELET
BASOS ABS: 0 10*3/uL (ref 0.0–0.2)
Basos: 0 %
EOS (ABSOLUTE): 0.3 10*3/uL (ref 0.0–0.4)
Eos: 4 %
Hematocrit: 35.7 % (ref 34.0–46.6)
Hemoglobin: 12.5 g/dL (ref 11.1–15.9)
IMMATURE GRANS (ABS): 0 10*3/uL (ref 0.0–0.1)
IMMATURE GRANULOCYTES: 0 %
LYMPHS: 20 %
Lymphocytes Absolute: 1.3 10*3/uL (ref 0.7–3.1)
MCH: 31.4 pg (ref 26.6–33.0)
MCHC: 35 g/dL (ref 31.5–35.7)
MCV: 90 fL (ref 79–97)
Monocytes Absolute: 0.7 10*3/uL (ref 0.1–0.9)
Monocytes: 10 %
NEUTROS PCT: 66 %
Neutrophils Absolute: 4.3 10*3/uL (ref 1.4–7.0)
PLATELETS: 211 10*3/uL (ref 150–379)
RBC: 3.98 x10E6/uL (ref 3.77–5.28)
RDW: 13.5 % (ref 12.3–15.4)
WBC: 6.7 10*3/uL (ref 3.4–10.8)

## 2017-01-16 LAB — CMP14+EGFR
A/G RATIO: 2 (ref 1.2–2.2)
ALBUMIN: 4.3 g/dL (ref 3.5–4.7)
ALK PHOS: 78 IU/L (ref 39–117)
ALT: 5 IU/L (ref 0–32)
AST: 16 IU/L (ref 0–40)
BUN / CREAT RATIO: 18 (ref 12–28)
BUN: 15 mg/dL (ref 8–27)
Bilirubin Total: 0.5 mg/dL (ref 0.0–1.2)
CO2: 23 mmol/L (ref 20–29)
CREATININE: 0.82 mg/dL (ref 0.57–1.00)
Calcium: 9.2 mg/dL (ref 8.7–10.3)
Chloride: 105 mmol/L (ref 96–106)
GFR calc Af Amer: 77 mL/min/{1.73_m2} (ref >59)
GFR, EST NON AFRICAN AMERICAN: 66 mL/min/{1.73_m2} (ref >59)
GLOBULIN, TOTAL: 2.2 g/dL (ref 1.5–4.5)
Glucose: 96 mg/dL (ref 65–99)
POTASSIUM: 4.1 mmol/L (ref 3.5–5.2)
SODIUM: 146 mmol/L — AB (ref 134–144)
Total Protein: 6.5 g/dL (ref 6.0–8.5)

## 2017-01-16 LAB — LIPID PANEL
CHOL/HDL RATIO: 3.3 ratio (ref 0.0–4.4)
CHOLESTEROL TOTAL: 165 mg/dL (ref 100–199)
HDL: 50 mg/dL (ref >39)
LDL CALC: 87 mg/dL (ref 0–99)
Triglycerides: 139 mg/dL (ref 0–149)
VLDL Cholesterol Cal: 28 mg/dL (ref 5–40)

## 2017-01-16 LAB — BAYER DCA HB A1C WAIVED: HB A1C: 7.1 % — AB (ref <7.0)

## 2017-01-17 ENCOUNTER — Telehealth: Payer: Self-pay | Admitting: Physician Assistant

## 2017-01-17 NOTE — Telephone Encounter (Signed)
Pt aware of results 

## 2017-01-31 ENCOUNTER — Encounter (INDEPENDENT_AMBULATORY_CARE_PROVIDER_SITE_OTHER): Payer: Medicare Other | Admitting: Ophthalmology

## 2017-02-05 ENCOUNTER — Other Ambulatory Visit: Payer: Self-pay | Admitting: Physician Assistant

## 2017-02-05 MED ORDER — GLUCOSE BLOOD VI STRP: ORAL_STRIP | 12 refills | Status: DC

## 2017-02-05 NOTE — Telephone Encounter (Signed)
What is the name of the medication? Needs test strips for ACCU Check Aviva  Have you contacted your pharmacy to request a refill? yes  Which pharmacy would you [facility] sent to? [facility]   Patient notified that their request is being sent to the clinical staff for review and that they should receive a call once it is complete. If they do not receive a call within 24 hours they can check with their pharmacy or our office.

## 2017-02-13 ENCOUNTER — Other Ambulatory Visit: Payer: Self-pay | Admitting: *Deleted

## 2017-02-13 MED ORDER — GLUCOSE BLOOD VI STRP: ORAL_STRIP | 12 refills | Status: DC

## 2017-02-15 DIAGNOSIS — E119 Type 2 diabetes mellitus without complications: Secondary | ICD-10-CM | POA: Diagnosis not present

## 2017-02-16 DIAGNOSIS — E119 Type 2 diabetes mellitus without complications: Secondary | ICD-10-CM | POA: Diagnosis not present

## 2017-04-04 DIAGNOSIS — Z23 Encounter for immunization: Secondary | ICD-10-CM | POA: Diagnosis not present

## 2017-07-05 ENCOUNTER — Other Ambulatory Visit: Payer: Self-pay | Admitting: *Deleted

## 2017-07-05 MED ORDER — RELION LANCETS MICRO-THIN 33G MISC: 2 refills | Status: DC

## 2017-07-05 MED ORDER — GLUCOSE BLOOD VI STRP: ORAL_STRIP | 2 refills | Status: DC

## 2017-07-07 DIAGNOSIS — E119 Type 2 diabetes mellitus without complications: Secondary | ICD-10-CM | POA: Diagnosis not present

## 2017-07-08 DIAGNOSIS — E119 Type 2 diabetes mellitus without complications: Secondary | ICD-10-CM | POA: Diagnosis not present

## 2017-07-19 ENCOUNTER — Encounter: Payer: Self-pay | Admitting: Physician Assistant

## 2017-07-19 ENCOUNTER — Ambulatory Visit: Payer: Medicare Other | Admitting: Physician Assistant

## 2017-07-19 DIAGNOSIS — E119 Type 2 diabetes mellitus without complications: Secondary | ICD-10-CM | POA: Diagnosis not present

## 2017-07-19 DIAGNOSIS — N3281 Overactive bladder: Secondary | ICD-10-CM

## 2017-07-19 DIAGNOSIS — Z8679 Personal history of other diseases of the circulatory system: Secondary | ICD-10-CM

## 2017-07-19 DIAGNOSIS — G629 Polyneuropathy, unspecified: Secondary | ICD-10-CM

## 2017-07-19 DIAGNOSIS — I10 Essential (primary) hypertension: Secondary | ICD-10-CM | POA: Diagnosis not present

## 2017-07-19 DIAGNOSIS — E7800 Pure hypercholesterolemia, unspecified: Secondary | ICD-10-CM | POA: Diagnosis not present

## 2017-07-19 LAB — BAYER DCA HB A1C WAIVED: HB A1C: 7.2 % — AB (ref <7.0)

## 2017-07-19 MED ORDER — METFORMIN HCL 500 MG PO TABS: 500.0000 mg | ORAL_TABLET | Freq: Two times a day (BID) | ORAL | 3 refills | Status: DC

## 2017-07-19 MED ORDER — ENALAPRIL MALEATE 20 MG PO TABS: 20.0000 mg | ORAL_TABLET | Freq: Two times a day (BID) | ORAL | 3 refills | Status: DC

## 2017-07-19 MED ORDER — GABAPENTIN 300 MG PO CAPS: 300.0000 mg | ORAL_CAPSULE | Freq: Every day | ORAL | 3 refills | Status: DC

## 2017-07-19 MED ORDER — TOLTERODINE TARTRATE ER 4 MG PO CP24: 4.0000 mg | ORAL_CAPSULE | Freq: Every day | ORAL | 3 refills | Status: DC

## 2017-07-19 MED ORDER — DILTIAZEM HCL 30 MG PO TABS
30.0000 mg | ORAL_TABLET | Freq: Two times a day (BID) | ORAL | 3 refills | Status: DC
Start: 2017-07-19 — End: 2018-04-22

## 2017-07-19 MED ORDER — ATORVASTATIN CALCIUM 10 MG PO TABS: 10.0000 mg | ORAL_TABLET | Freq: Every day | ORAL | 3 refills | Status: DC

## 2017-07-19 NOTE — Progress Notes (Signed)
BP (!) 173/95   Pulse 73   Temp (!) 97.3 F (36.3 C) (Oral)   Ht _0  (1.6 m)   Wt 139 lb 3.2 oz (63.1 kg)   BMI 24.66 kg/m    Subjective:    Patient ID: Julie Riley, female    DOB: 03/19/1932, 84 y.o.   MRN: [id number]  HPI: Julie Riley is a 84 y.o. female presenting on 07/19/2017 for Hypertension (6 month follow up) and Diabetes  This patient comes in for 28-monthrecheck on her hypertension and diabetes.  She does have a history of atrial fibrillation, neuropathy, type 2 diabetes that is well controlled, overactive bladder.  She reports that all of her medications are doing very well at this time.  She is not having any difficulties with high sugars or hypoglycemic events.  She is still quite active and does lots around the house.  She states that they even still have everyone to their house for the holidays.  Relevant past medical, surgical, family and social history reviewed and updated as indicated. Allergies and medications reviewed and updated.  Past Medical History:  Diagnosis Date  . CAD (coronary artery disease)    nonobsturctive.   . Carotid stenosis    Less than 50% bilateral ICA stenosis, 2008.  . Diabetes mellitus ([place]   . HLD (hyperlipidemia)   . HTN (hypertension)     Past Surgical History:  Procedure Laterality Date  . APPENDECTOMY    . CHOLECYSTECTOMY    . partial hysterectomy --- unknown      Review of Systems  Constitutional: Negative.  Negative for activity change, fatigue and fever.  HENT: Negative.   Eyes: Negative.   Respiratory: Negative.  Negative for cough.   Cardiovascular: Negative.  Negative for chest pain.  Gastrointestinal: Negative.  Negative for abdominal pain.  Endocrine: Negative.   Genitourinary: Negative.  Negative for dysuria.  Musculoskeletal: Negative.   Skin: Negative.   Neurological: Negative.     Allergies as of 07/19/2017      Reactions   Amoxicillin    Penicillins    Xarelto [rivaroxaban] Other (See  Comments)   Severe diarrhea      Medication List        Accurate as of 07/19/17  9:14 AM. Always use your most recent med list.          aspirin 325 MG EC tablet Take 325 mg by mouth daily.   atorvastatin 10 MG tablet Commonly known as:  LIPITOR Take 1 tablet (10 mg total) by mouth daily.   diltiazem 30 MG tablet Commonly known as:  CARDIZEM Take 1 tablet (30 mg total) by mouth 2 (two) times daily.   enalapril 20 MG tablet Commonly known as:  VASOTEC Take 1 tablet (20 mg total) by mouth 2 (two) times daily.   gabapentin 300 MG capsule Commonly known as:  NEURONTIN Take 1 capsule (300 mg total) by mouth daily.   glucose blood test strip Use to check blood sugars two times daily   metFORMIN 500 MG tablet Commonly known as:  GLUCOPHAGE Take 1 tablet (500 mg total) by mouth 2 (two) times daily with a meal.   RELION LANCETS MICRO-THIN 33G Misc Use to check blood sugars two times daily   tolterodine 4 MG 24 hr capsule Commonly known as:  DETROL LA Take 1 capsule (4 mg total) by mouth daily.   Vitamin D-3 5000 units Tabs Take 1 tablet by mouth daily.  Objective:    BP (!) 173/95   Pulse 73   Temp (!) 97.3 F (36.3 C) (Oral)   Ht _0  (1.6 m)   Wt 139 lb 3.2 oz (63.1 kg)   BMI 24.66 kg/m   Allergies  Allergen Reactions  . Amoxicillin   . Penicillins   . Xarelto [Rivaroxaban] Other (See Comments)    Severe diarrhea    Physical Exam  Constitutional: She is oriented to person, place, and time. She appears well-developed and well-nourished.  HENT:  Head: Normocephalic and atraumatic.  Right Ear: Tympanic membrane, external ear and ear canal normal.  Left Ear: Tympanic membrane, external ear and ear canal normal.  Nose: Nose normal. No rhinorrhea.  Mouth/Throat: Oropharynx is clear and moist and mucous membranes are normal. No oropharyngeal exudate or posterior oropharyngeal erythema.  Eyes: Conjunctivae and EOM are normal. Pupils are equal,  round, and reactive to light.  Neck: Normal range of motion. Neck supple.  Cardiovascular: Normal rate, regular rhythm, normal heart sounds and intact distal pulses.  Pulmonary/Chest: Effort normal and breath sounds normal.  Abdominal: Soft. Bowel sounds are normal.  Neurological: She is alert and oriented to person, place, and time. She has normal reflexes.  Skin: Skin is warm and dry. No rash noted.  Psychiatric: She has a normal mood and affect. Her behavior is normal. Judgment and thought content normal.    Results for orders placed or performed in visit on 01/15/17  CMP14+EGFR  Result Value Ref Range   Glucose 96 65 - 99 mg/dL   BUN 15 8 - 27 mg/dL   Creatinine, Ser 0.82 0.57 - 1.00 mg/dL   GFR calc non Af Amer 66 >59 mL/min/1.73   GFR calc Af Amer 77 >59 mL/min/1.73   BUN/Creatinine Ratio 18 12 - 28   Sodium 146 (H) 134 - 144 mmol/L   Potassium 4.1 3.5 - 5.2 mmol/L   Chloride 105 96 - 106 mmol/L   CO2 23 20 - 29 mmol/L   Calcium 9.2 8.7 - 10.3 mg/dL   Total Protein 6.5 6.0 - 8.5 g/dL   Albumin 4.3 3.5 - 4.7 g/dL   Globulin, Total 2.2 1.5 - 4.5 g/dL   Albumin/Globulin Ratio 2.0 1.2 - 2.2   Bilirubin Total 0.5 0.0 - 1.2 mg/dL   Alkaline Phosphatase 78 39 - 117 IU/L   AST 16 0 - 40 IU/L   ALT 5 0 - 32 IU/L  Lipid panel  Result Value Ref Range   Cholesterol, Total 165 100 - 199 mg/dL   Triglycerides 139 0 - 149 mg/dL   HDL 50 >39 mg/dL   VLDL Cholesterol Cal 28 5 - 40 mg/dL   LDL Calculated 87 0 - 99 mg/dL   Chol/HDL Ratio 3.3 0.0 - 4.4 ratio  CBC with Differential/Platelet  Result Value Ref Range   WBC 6.7 3.4 - 10.8 x10E3/uL   RBC 3.98 3.77 - 5.28 x10E6/uL   Hemoglobin 12.5 11.1 - 15.9 g/dL   Hematocrit 35.7 34.0 - 46.6 %   MCV 90 79 - 97 fL   MCH 31.4 26.6 - 33.0 pg   MCHC 35.0 31.5 - 35.7 g/dL   RDW 13.5 12.3 - 15.4 %   Platelets 211 150 - 379 x10E3/uL   Neutrophils 66 Not Estab. %   Lymphs 20 Not Estab. %   Monocytes 10 Not Estab. %   Eos 4 Not Estab. %    Basos 0 Not Estab. %   Neutrophils Absolute 4.3 1.4 -  7.0 x10E3/uL   Lymphocytes Absolute 1.3 0.7 - 3.1 x10E3/uL   Monocytes Absolute 0.7 0.1 - 0.9 x10E3/uL   EOS (ABSOLUTE) 0.3 0.0 - 0.4 x10E3/uL   Basophils Absolute 0.0 0.0 - 0.2 x10E3/uL   Immature Granulocytes 0 Not Estab. %   Immature Grans (Abs) 0.0 0.0 - 0.1 x10E3/uL  Bayer DCA Hb A1c Waived  Result Value Ref Range   Bayer DCA Hb A1c Waived 7.1 (H) <7.0 %      Assessment & Plan:   1. Pure hypercholesterolemia - atorvastatin (LIPITOR) 10 MG tablet; Take 1 tablet (10 mg total) by mouth daily.  Dispense: 90 tablet; Refill: 3 - CMP14+EGFR - CBC with Differential/Platelet - Bayer DCA Hb A1c Waived - Lipid panel  2. Essential hypertension - diltiazem (CARDIZEM) 30 MG tablet; Take 1 tablet (30 mg total) by mouth 2 (two) times daily.  Dispense: 180 tablet; Refill: 3 - enalapril (VASOTEC) 20 MG tablet; Take 1 tablet (20 mg total) by mouth 2 (two) times daily.  Dispense: 180 tablet; Refill: 3 - CMP14+EGFR - CBC with Differential/Platelet - Bayer DCA Hb A1c Waived - Lipid panel  3. History of atrial fibrillation - diltiazem (CARDIZEM) 30 MG tablet; Take 1 tablet (30 mg total) by mouth 2 (two) times daily.  Dispense: 180 tablet; Refill: 3  4. Neuropathy - gabapentin (NEURONTIN) 300 MG capsule; Take 1 capsule (300 mg total) by mouth daily.  Dispense: 90 capsule; Refill: 3  5. Type 2 diabetes mellitus without complication, without long-term current use of insulin (HCC) - metFORMIN (GLUCOPHAGE) 500 MG tablet; Take 1 tablet (500 mg total) by mouth 2 (two) times daily with a meal.  Dispense: 180 tablet; Refill: 3  6. OAB (overactive bladder) - tolterodine (DETROL LA) 4 MG 24 hr capsule; Take 1 capsule (4 mg total) by mouth daily.  Dispense: 90 capsule; Refill: 3    Current Outpatient Medications:  .  aspirin 325 MG EC tablet, Take 325 mg by mouth daily., Disp: , Rfl:  .  atorvastatin (LIPITOR) 10 MG tablet, Take 1 tablet (10 mg  total) by mouth daily., Disp: 90 tablet, Rfl: 3 .  Cholecalciferol (VITAMIN D-3) 5000 units TABS, Take 1 tablet by mouth daily., Disp: 30 tablet, Rfl:  .  diltiazem (CARDIZEM) 30 MG tablet, Take 1 tablet (30 mg total) by mouth 2 (two) times daily., Disp: 180 tablet, Rfl: 3 .  enalapril (VASOTEC) 20 MG tablet, Take 1 tablet (20 mg total) by mouth 2 (two) times daily., Disp: 180 tablet, Rfl: 3 .  gabapentin (NEURONTIN) 300 MG capsule, Take 1 capsule (300 mg total) by mouth daily., Disp: 90 capsule, Rfl: 3 .  glucose blood test strip, Use to check blood sugars two times daily, Disp: 100 each, Rfl: 2 .  metFORMIN (GLUCOPHAGE) 500 MG tablet, Take 1 tablet (500 mg total) by mouth 2 (two) times daily with a meal., Disp: 180 tablet, Rfl: 3 .  RELION LANCETS MICRO-THIN 33G MISC, Use to check blood sugars two times daily, Disp: 100 each, Rfl: 2 .  tolterodine (DETROL LA) 4 MG 24 hr capsule, Take 1 capsule (4 mg total) by mouth daily., Disp: 90 capsule, Rfl: 3 Continue all other maintenance medications as listed above.  Follow up plan: Return in about 6 months (around 01/16/2018) for recheck.  Educational handout given for [place] PA-C [facility] [address]  [place], [place] [address] [phone]   07/19/2017, 9:14 AM

## 2017-07-19 NOTE — Patient Instructions (Signed)
In a few days you may receive a survey in the mail or online from [employer] regarding your visit with us today. Please take a moment to fill this out. Your feedback is very important to our whole office. It can help us better understand your needs as well as improve your experience and satisfaction. Thank you for taking your time to complete it. We care about you.  Inmer Nix, PA-C  

## 2017-07-20 LAB — CMP14+EGFR
A/G RATIO: 1.8 (ref 1.2–2.2)
ALBUMIN: 4.2 g/dL (ref 3.5–4.7)
ALK PHOS: 85 IU/L (ref 39–117)
ALT: 12 IU/L (ref 0–32)
AST: 18 IU/L (ref 0–40)
BILIRUBIN TOTAL: 0.5 mg/dL (ref 0.0–1.2)
BUN / CREAT RATIO: 16 (ref 12–28)
BUN: 12 mg/dL (ref 8–27)
CHLORIDE: 103 mmol/L (ref 96–106)
CO2: 25 mmol/L (ref 20–29)
Calcium: 9.3 mg/dL (ref 8.7–10.3)
Creatinine, Ser: 0.76 mg/dL (ref 0.57–1.00)
GFR calc non Af Amer: 73 mL/min/{1.73_m2} (ref >59)
GFR, EST AFRICAN AMERICAN: 84 mL/min/{1.73_m2} (ref >59)
GLOBULIN, TOTAL: 2.3 g/dL (ref 1.5–4.5)
Glucose: 92 mg/dL (ref 65–99)
POTASSIUM: 4.1 mmol/L (ref 3.5–5.2)
SODIUM: 143 mmol/L (ref 134–144)
TOTAL PROTEIN: 6.5 g/dL (ref 6.0–8.5)

## 2017-07-20 LAB — CBC WITH DIFFERENTIAL/PLATELET
BASOS ABS: 0 10*3/uL (ref 0.0–0.2)
Basos: 0 %
EOS (ABSOLUTE): 0.2 10*3/uL (ref 0.0–0.4)
Eos: 3 %
HEMOGLOBIN: 12.9 g/dL (ref 11.1–15.9)
Hematocrit: 38.7 % (ref 34.0–46.6)
Immature Grans (Abs): 0 10*3/uL (ref 0.0–0.1)
Immature Granulocytes: 0 %
LYMPHS ABS: 1.7 10*3/uL (ref 0.7–3.1)
Lymphs: 24 %
MCH: 30.8 pg (ref 26.6–33.0)
MCHC: 33.3 g/dL (ref 31.5–35.7)
MCV: 92 fL (ref 79–97)
MONOS ABS: 0.6 10*3/uL (ref 0.1–0.9)
Monocytes: 8 %
NEUTROS ABS: 4.5 10*3/uL (ref 1.4–7.0)
Neutrophils: 65 %
Platelets: 207 10*3/uL (ref 150–379)
RBC: 4.19 x10E6/uL (ref 3.77–5.28)
RDW: 13.5 % (ref 12.3–15.4)
WBC: 7 10*3/uL (ref 3.4–10.8)

## 2017-07-20 LAB — LIPID PANEL
Chol/HDL Ratio: 2.8 ratio (ref 0.0–4.4)
Cholesterol, Total: 140 mg/dL (ref 100–199)
HDL: 50 mg/dL (ref >39)
LDL Calculated: 64 mg/dL (ref 0–99)
Triglycerides: 130 mg/dL (ref 0–149)
VLDL Cholesterol Cal: 26 mg/dL (ref 5–40)

## 2017-09-02 ENCOUNTER — Encounter: Payer: Self-pay | Admitting: Physician Assistant

## 2017-09-02 ENCOUNTER — Ambulatory Visit: Payer: Medicare Other | Admitting: Physician Assistant

## 2017-09-02 VITALS — BP 144/74 | HR 91 | Temp 97.7°F | Ht 63.0 in | Wt 135.2 lb

## 2017-09-02 DIAGNOSIS — K1120 Sialoadenitis, unspecified: Secondary | ICD-10-CM | POA: Diagnosis not present

## 2017-09-02 MED ORDER — CLINDAMYCIN HCL 150 MG PO CAPS: 150.0000 mg | ORAL_CAPSULE | Freq: Three times a day (TID) | ORAL | 0 refills | Status: DC

## 2017-09-02 NOTE — Patient Instructions (Signed)
Parotitis Parotitis means that you have irritation and swelling (inflammation) in one or both of your parotid glands. These glands make spit (saliva). They are found on each side of your face, below and in front of your earlobes. You may or may not have pain with this condition. Follow these instructions at home: Medicines  Take over-the-counter and prescription medicines only as told by your doctor.  If you were prescribed an antibiotic medicine, take it as told by your doctor. Do not stop taking the antibiotic even if you start to feel better. Managing pain and swelling  Apply warm cloths (compresses) to the swollen area as told by your doctor.  Gently rub your parotid glands as told by your doctor. General instructions   Drink enough fluid to keep your pee (urine) clear or pale yellow.  Suck on sour candy. This may help: ? To make your mouth less dry. ? To make more spit.  Keep your mouth clean and moist. ? Gargle with a salt-water mixture 3-4 times per day, or as needed. ? To make a salt-water mixture, stir -1 tsp of salt into 1 cup of warm water.  Take good care of your mouth: ? Brush your teeth at least two times per day. ? Floss your teeth every day. ? See your dentist regularly.  Do not use tobacco products. These include cigarettes, chewing tobacco, or e-cigarettes. If you need help quitting,  ask your doctor.  Keep all follow-up visits as told by your doctor. This is important. Contact a doctor if:  You have a fever or chills.  You have new symptoms.  Your symptoms get worse.  Your symptoms do not get better with treatment. This information is not intended to replace advice given to you by your health care provider. Make sure you discuss any questions you have with your health care provider. Document Released: 07/14/2010 Document Revised: 11/17/2015 Document Reviewed: 11/04/2014 Elsevier Interactive Patient Education  2018 Elsevier Inc.  

## 2017-09-03 NOTE — Progress Notes (Signed)
BP (!) 144/74   Pulse 91   Temp 97.7 F (36.5 C) (Oral)   Ht 5\' 3"  (1.6 m)   Wt 135 lb 3.2 oz (61.3 kg)   BMI 23.95 kg/m    Subjective:    Patient ID: Julie Riley, female    DOB: 1932/04/02, 84 y.o.   MRN: [id number]  HPI: Julie Riley is a 84 y.o. female presenting on 09/02/2017 for Knot behind ear The right parotid gland is swollen and red. It is warmth to the touch. She denies fever or chills. There is some warmth. It hurts to thetouch when in bed and turns on it. No cuts or abrasions on the skin.   Past Medical History:  Diagnosis Date  . CAD (coronary artery disease)    nonobsturctive.   . Carotid stenosis    Less than 50% bilateral ICA stenosis, 2008.  . Diabetes mellitus (HCC)   . HLD (hyperlipidemia)   . HTN (hypertension)    Relevant past medical, surgical, family and social history reviewed and updated as indicated. Interim medical history since our last visit reviewed. Allergies and medications reviewed and updated. DATA REVIEWED: CHART IN EPIC  Family History reviewed for pertinent findings.  Review of Systems  Constitutional: Negative.  Negative for chills and fever.  HENT: Positive for facial swelling. Negative for congestion, ear discharge, ear pain, sore throat, trouble swallowing and voice change.   Eyes: Negative.   Respiratory: Negative.   Gastrointestinal: Negative.   Genitourinary: Negative.     Allergies as of 09/02/2017      Reactions   Amoxicillin    Penicillins    Xarelto [rivaroxaban] Other (See Comments)   Severe diarrhea      Medication List        Accurate as of 09/02/17 11:59 PM. Always use your most recent med list.          aspirin 325 MG EC tablet Take 325 mg by mouth daily.   atorvastatin 10 MG tablet Commonly known as:  LIPITOR Take 1 tablet (10 mg total) by mouth daily.   clindamycin 150 MG capsule Commonly known as:  CLEOCIN Take 1 capsule (150 mg total) by mouth 3 (three) times daily.   diltiazem 30 MG  tablet Commonly known as:  CARDIZEM Take 1 tablet (30 mg total) by mouth 2 (two) times daily.   enalapril 20 MG tablet Commonly known as:  VASOTEC Take 1 tablet (20 mg total) by mouth 2 (two) times daily.   gabapentin 300 MG capsule Commonly known as:  NEURONTIN Take 1 capsule (300 mg total) by mouth daily.   glucose blood test strip Use to check blood sugars two times daily   metFORMIN 500 MG tablet Commonly known as:  GLUCOPHAGE Take 1 tablet (500 mg total) by mouth 2 (two) times daily with a meal.   RELION LANCETS MICRO-THIN 33G Misc Use to check blood sugars two times daily   tolterodine 4 MG 24 hr capsule Commonly known as:  DETROL LA Take 1 capsule (4 mg total) by mouth daily.   Vitamin D-3 5000 units Tabs Take 1 tablet by mouth daily.          Objective:    BP (!) 144/74   Pulse 91   Temp 97.7 F (36.5 C) (Oral)   Ht 5\' 3"  (1.6 m)   Wt 135 lb 3.2 oz (61.3 kg)   BMI 23.95 kg/m   Allergies  Allergen Reactions  . Amoxicillin   .  Penicillins   . Xarelto [Rivaroxaban] Other (See Comments)    Severe diarrhea    Wt Readings from Last 3 Encounters:  09/02/17 135 lb 3.2 oz (61.3 kg)  07/19/17 139 lb 3.2 oz (63.1 kg)  01/15/17 136 lb 3.2 oz (61.8 kg)    Physical Exam  Constitutional: She is oriented to person, place, and time. She appears well-developed and well-nourished.  HENT:  Head: Normocephalic and atraumatic.    Warm red swelling over the right parotid  Eyes: Conjunctivae and EOM are normal. Pupils are equal, round, and reactive to light.  Cardiovascular: Normal rate, regular rhythm, normal heart sounds and intact distal pulses.  Pulmonary/Chest: Effort normal and breath sounds normal.  Abdominal: Soft. Bowel sounds are normal.  Neurological: She is alert and oriented to person, place, and time. She has normal reflexes.  Skin: Skin is warm and dry. No rash noted.  Psychiatric: She has a normal mood and affect. Her behavior is normal. Judgment  and thought content normal.        Assessment & Plan:   1. Parotiditis - clindamycin (CLEOCIN) 150 MG capsule; Take 1 capsule (150 mg total) by mouth 3 (three) times daily.  Dispense: 30 capsule; Refill: 0  Continue all other maintenance medications as listed above.  Follow up plan: No Follow-up on file.  Educational handout given for parotiditis  Remus LofflerAngel S. Rogan Wigley PA-C [facility] [address]  [place], [place] [address] [phone]   09/03/2017, 10:06 PM/

## 2017-09-13 ENCOUNTER — Other Ambulatory Visit: Payer: Self-pay | Admitting: Physician Assistant

## 2017-09-13 DIAGNOSIS — G629 Polyneuropathy, unspecified: Secondary | ICD-10-CM

## 2017-09-13 MED ORDER — GABAPENTIN 300 MG PO CAPS: 300.0000 mg | ORAL_CAPSULE | Freq: Every day | ORAL | 0 refills | Status: DC

## 2017-09-16 NOTE — Telephone Encounter (Signed)
Refill sent, not able to reach pt

## 2017-09-19 DIAGNOSIS — L110 Acquired keratosis follicularis: Secondary | ICD-10-CM | POA: Diagnosis not present

## 2017-09-19 DIAGNOSIS — E1142 Type 2 diabetes mellitus with diabetic polyneuropathy: Secondary | ICD-10-CM | POA: Diagnosis not present

## 2017-09-19 DIAGNOSIS — M79671 Pain in right foot: Secondary | ICD-10-CM | POA: Diagnosis not present

## 2017-09-19 DIAGNOSIS — E1151 Type 2 diabetes mellitus with diabetic peripheral angiopathy without gangrene: Secondary | ICD-10-CM | POA: Diagnosis not present

## 2017-12-17 ENCOUNTER — Encounter: Payer: Self-pay | Admitting: *Deleted

## 2018-01-07 ENCOUNTER — Encounter: Payer: Self-pay | Admitting: Family

## 2018-01-07 ENCOUNTER — Ambulatory Visit (INDEPENDENT_AMBULATORY_CARE_PROVIDER_SITE_OTHER): Payer: Medicare Other | Admitting: Family

## 2018-01-07 ENCOUNTER — Other Ambulatory Visit: Payer: Self-pay | Admitting: Physician Assistant

## 2018-01-07 VITALS — BP 175/77 | HR 66 | Temp 98.4°F | Ht 63.0 in | Wt 133.6 lb

## 2018-01-07 DIAGNOSIS — B372 Candidiasis of skin and nail: Secondary | ICD-10-CM

## 2018-01-07 DIAGNOSIS — R21 Rash and other nonspecific skin eruption: Secondary | ICD-10-CM | POA: Diagnosis not present

## 2018-01-07 DIAGNOSIS — G629 Polyneuropathy, unspecified: Secondary | ICD-10-CM

## 2018-01-07 MED ORDER — NYSTATIN 100000 UNIT/GM EX POWD: Freq: Four times a day (QID) | CUTANEOUS | 2 refills | Status: DC

## 2018-01-07 MED ORDER — FLUCONAZOLE 150 MG PO TABS: 150.0000 mg | ORAL_TABLET | ORAL | 0 refills | Status: DC | PRN

## 2018-01-07 MED ORDER — KETOCONAZOLE 2 % EX CREA: 1.0000 "application " | TOPICAL_CREAM | Freq: Every day | CUTANEOUS | 0 refills | Status: DC

## 2018-01-07 NOTE — Patient Instructions (Signed)
Skin Yeast Infection Skin yeast infection is a condition in which there is an overgrowth of yeast (candida) that normally lives on the skin. This condition usually occurs in areas of the skin that are constantly warm and moist, such as the armpits or the groin. What are the causes? This condition is caused by a change in the normal balance of the yeast and bacteria that live on the skin. What increases the risk? This condition is more likely to develop in:  People who are obese.  Pregnant women.  Women who take birth control pills.  People who have diabetes.  People who take antibiotic medicines.  People who take steroid medicines.  People who are malnourished.  People who have a weak defense (immune) system.  People who are 65 years of age or older.  What are the signs or symptoms? Symptoms of this condition include:  A red, swollen area of the skin.  Bumps on the skin.  Itchiness.  How is this diagnosed? This condition is diagnosed with a medical history and physical exam. Your health care provider may check for yeast by taking light scrapings of the skin to be viewed under a microscope. How is this treated? This condition is treated with medicine. Medicines may be prescribed or be available over-the-counter. The medicines may be:  Taken by mouth (orally).  Applied as a cream.  Follow these instructions at home:  Take or apply over-the-counter and prescription medicines only as told by your health care provider.  Eat more yogurt. This may help to keep your yeast infection from returning.  Maintain a healthy weight. If you need help losing weight, talk with your health care provider.  Keep your skin clean and dry.  If you have diabetes, keep your blood sugar under control. Contact a health care provider if:  Your symptoms go away and then return.  Your symptoms do not get better with treatment.  Your symptoms get worse.  Your rash spreads.  You have a  fever or chills.  You have new symptoms.  You have new warmth or redness of your skin. This information is not intended to replace advice given to you by your health care provider. Make sure you discuss any questions you have with your health care provider. Document Released: 02/27/2011 Document Revised: 02/05/2016 Document Reviewed: 12/13/2014 Elsevier Interactive Patient Education  2018 Elsevier Inc.  

## 2018-01-07 NOTE — Progress Notes (Signed)
   Subjective:    Patient ID: Julie Riley, female    DOB: 11/12/1931, 84 y.o.   MRN: [id number]  Chief Complaint  Patient presents with  . rash with itching and burning on chest    Rash  This is a new problem. The current episode started 1 to 4 weeks ago. The problem has been gradually worsening since onset. The affected locations include the chest. The rash is characterized by redness, itchiness and draining. It is unknown if there was an exposure to a precipitant. Pertinent negatives include no congestion, cough, diarrhea, fatigue, fever or shortness of breath. Past treatments include antibiotic cream (baby powder ). The treatment provided mild relief.      Review of Systems  Constitutional: Negative for fatigue and fever.  HENT: Negative for congestion.   Respiratory: Negative for cough and shortness of breath.   Gastrointestinal: Negative for diarrhea.  Skin: Positive for rash.  All other systems reviewed and are negative.      Objective:   Physical Exam  Constitutional: She is oriented to person, place, and time. She appears well-developed and well-nourished. No distress.  HENT:  Head: Normocephalic and atraumatic.  Eyes: Pupils are equal, round, and reactive to light.  Cardiovascular: Normal rate, regular rhythm, normal heart sounds and intact distal pulses.  No murmur heard. Pulmonary/Chest: Effort normal and breath sounds normal. No respiratory distress. She has no wheezes.  Abdominal: Soft. Bowel sounds are normal. She exhibits no distension. There is no tenderness.  Musculoskeletal: Normal range of motion. She exhibits no edema or tenderness.  Neurological: She is alert and oriented to person, place, and time. She has normal reflexes. No cranial nerve deficit.  Skin: Skin is warm and dry. Rash (erythemas, excoriated yeast rash under bilateral breasts. ) noted.     Psychiatric: She has a normal mood and affect. Her behavior is normal. Judgment and thought content  normal.  Vitals reviewed.     BP (!) 175/77   Pulse 66   Temp 98.4 F (36.9 C) (Oral)   Ht 5\' 3"  (1.6 m)   Wt 133 lb 9.6 oz (60.6 kg)   BMI 23.67 kg/m      Assessment & Plan:  Julie DandyMary was seen today for rash with itching and burning on chest.  Diagnoses and all orders for this visit:  Rash  Candidiasis, cutaneous -     fluconazole (DIFLUCAN) 150 MG tablet; Take 1 tablet (150 mg total) by mouth every three (3) days as needed. -     nystatin (MYCOSTATIN/NYSTOP) powder; Apply topically 4 (four) times daily. -     ketoconazole (NIZORAL) 2 % cream; Apply 1 application topically daily.   Do not scratch Keep clean and dry Use Nystatin Powder throughout day Probiotic daily  Jannifer Rodneyhristy Annaliese Saez, FNP

## 2018-01-16 ENCOUNTER — Encounter: Payer: Self-pay | Admitting: Physician Assistant

## 2018-01-16 ENCOUNTER — Ambulatory Visit (INDEPENDENT_AMBULATORY_CARE_PROVIDER_SITE_OTHER): Payer: Medicare Other | Admitting: Physician Assistant

## 2018-01-16 VITALS — BP 144/70 | HR 63 | Temp 98.7°F | Ht 63.0 in | Wt 132.8 lb

## 2018-01-16 DIAGNOSIS — E119 Type 2 diabetes mellitus without complications: Secondary | ICD-10-CM

## 2018-01-16 DIAGNOSIS — E7800 Pure hypercholesterolemia, unspecified: Secondary | ICD-10-CM | POA: Diagnosis not present

## 2018-01-16 LAB — BAYER DCA HB A1C WAIVED: HB A1C (BAYER DCA - WAIVED): 6.6 % (ref <7.0)

## 2018-01-16 NOTE — Progress Notes (Addendum)
BP (!) 144/70   Pulse 63   Temp 98.7 F (37.1 C) (Oral)   Ht '5\' 3"'  (1.6 m)   Wt 132 lb 12.8 oz (60.2 kg)   BMI 23.52 kg/m    Subjective:    Patient ID: Julie Riley, female    DOB: 1931/12/21, 84 y.o.   MRN: [id number]  HPI: Julie Riley is a 84 y.o. female presenting on 01/16/2018 for Diabetes; Hypertension; and Hyperlipidemia  This patient comes in for periodic recheck on medications and conditions including diabetes, hypertension, hyperlipid. She reports no new problems and is doing very well.  All medications are reviewed today. There are no reports of any problems with the medications. All of the medical conditions are reviewed and updated.  Lab work is reviewed and will be ordered as medically necessary. There are no new problems reported with today's visit.   Past Medical History:  Diagnosis Date  . CAD (coronary artery disease)    nonobsturctive.   . Carotid stenosis    Less than 50% bilateral ICA stenosis, 2008.  . Diabetes mellitus ([place])   . HLD (hyperlipidemia)   . HTN (hypertension)    Relevant past medical, surgical, family and social history reviewed and updated as indicated. Interim medical history since our last visit reviewed. Allergies and medications reviewed and updated. DATA REVIEWED: CHART IN EPIC  Family History reviewed for pertinent findings.  Review of Systems  Constitutional: Negative.  Negative for activity change, fatigue and fever.  HENT: Negative.   Eyes: Negative.   Respiratory: Negative.  Negative for cough.   Cardiovascular: Negative.  Negative for chest pain.  Gastrointestinal: Negative.  Negative for abdominal pain.  Endocrine: Negative.   Genitourinary: Negative.  Negative for dysuria.  Musculoskeletal: Positive for arthralgias.  Skin: Negative.   Neurological: Negative.     Allergies as of 01/16/2018      Reactions   Amoxicillin    Penicillins    Xarelto [rivaroxaban] Other (See Comments)   Severe diarrhea        Medication List        Accurate as of 01/16/18 11:34 AM. Always use your most recent med list.          aspirin 325 MG EC tablet Take 325 mg by mouth daily.   atorvastatin 10 MG tablet Commonly known as:  LIPITOR Take 1 tablet (10 mg total) by mouth daily.   diltiazem 30 MG tablet Commonly known as:  CARDIZEM Take 1 tablet (30 mg total) by mouth 2 (two) times daily.   enalapril 20 MG tablet Commonly known as:  VASOTEC Take 1 tablet (20 mg total) by mouth 2 (two) times daily.   fluconazole 150 MG tablet Commonly known as:  DIFLUCAN Take 1 tablet (150 mg total) by mouth every three (3) days as needed.   gabapentin 300 MG capsule Commonly known as:  NEURONTIN TAKE 1 CAPSULE BY MOUTH ONCE DAILY   glucose blood test strip Use to check blood sugars two times daily   ketoconazole 2 % cream Commonly known as:  NIZORAL Apply 1 application topically daily.   metFORMIN 500 MG tablet Commonly known as:  GLUCOPHAGE Take 1 tablet (500 mg total) by mouth 2 (two) times daily with a meal.   nystatin powder Commonly known as:  MYCOSTATIN/NYSTOP Apply topically 4 (four) times daily.   RELION LANCETS MICRO-THIN 33G Misc Use to check blood sugars two times daily   tolterodine 4 MG 24 hr capsule Commonly known as:  DETROL LA Take 1 capsule (4 mg total) by mouth daily.   Vitamin D-3 5000 units Tabs Take 1 tablet by mouth daily.          Objective:    BP (!) 144/70   Pulse 63   Temp 98.7 F (37.1 C) (Oral)   Ht '5\' 3"'  (1.6 m)   Wt 132 lb 12.8 oz (60.2 kg)   BMI 23.52 kg/m   Allergies  Allergen Reactions  . Amoxicillin   . Penicillins   . Xarelto [Rivaroxaban] Other (See Comments)    Severe diarrhea    Wt Readings from Last 3 Encounters:  01/16/18 132 lb 12.8 oz (60.2 kg)  01/07/18 133 lb 9.6 oz (60.6 kg)  09/02/17 135 lb 3.2 oz (61.3 kg)    Physical Exam  Constitutional: She is oriented to person, place, and time. She appears well-developed and  well-nourished.  HENT:  Head: Normocephalic and atraumatic.  Right Ear: Tympanic membrane, external ear and ear canal normal.  Left Ear: Tympanic membrane, external ear and ear canal normal.  Nose: Nose normal. No rhinorrhea.  Mouth/Throat: Oropharynx is clear and moist and mucous membranes are normal. No oropharyngeal exudate or posterior oropharyngeal erythema.  Eyes: Pupils are equal, round, and reactive to light. Conjunctivae and EOM are normal.  Neck: Normal range of motion. Neck supple.  Cardiovascular: Normal rate, regular rhythm, normal heart sounds and intact distal pulses.  Pulmonary/Chest: Effort normal and breath sounds normal.  Abdominal: Soft. Bowel sounds are normal.  Neurological: She is alert and oriented to person, place, and time. She has normal reflexes.  Skin: Skin is warm and dry. No rash noted.  Psychiatric: She has a normal mood and affect. Her behavior is normal. Judgment and thought content normal.    Results for orders placed or performed in visit on 07/19/17  CMP14+EGFR  Result Value Ref Range   Glucose 92 65 - 99 mg/dL   BUN 12 8 - 27 mg/dL   Creatinine, Ser 0.76 0.57 - 1.00 mg/dL   GFR calc non Af Amer 73 >59 mL/min/1.73   GFR calc Af Amer 84 >59 mL/min/1.73   BUN/Creatinine Ratio 16 12 - 28   Sodium 143 134 - 144 mmol/L   Potassium 4.1 3.5 - 5.2 mmol/L   Chloride 103 96 - 106 mmol/L   CO2 25 20 - 29 mmol/L   Calcium 9.3 8.7 - 10.3 mg/dL   Total Protein 6.5 6.0 - 8.5 g/dL   Albumin 4.2 3.5 - 4.7 g/dL   Globulin, Total 2.3 1.5 - 4.5 g/dL   Albumin/Globulin Ratio 1.8 1.2 - 2.2   Bilirubin Total 0.5 0.0 - 1.2 mg/dL   Alkaline Phosphatase 85 39 - 117 IU/L   AST 18 0 - 40 IU/L   ALT 12 0 - 32 IU/L  CBC with Differential/Platelet  Result Value Ref Range   WBC 7.0 3.4 - 10.8 x10E3/uL   RBC 4.19 3.77 - 5.28 x10E6/uL   Hemoglobin 12.9 11.1 - 15.9 g/dL   Hematocrit 38.7 34.0 - 46.6 %   MCV 92 79 - 97 fL   MCH 30.8 26.6 - 33.0 pg   MCHC 33.3 31.5 -  35.7 g/dL   RDW 13.5 12.3 - 15.4 %   Platelets 207 150 - 379 x10E3/uL   Neutrophils 65 Not Estab. %   Lymphs 24 Not Estab. %   Monocytes 8 Not Estab. %   Eos 3 Not Estab. %   Basos 0 Not Estab. %  Neutrophils Absolute 4.5 1.4 - 7.0 x10E3/uL   Lymphocytes Absolute 1.7 0.7 - 3.1 x10E3/uL   Monocytes Absolute 0.6 0.1 - 0.9 x10E3/uL   EOS (ABSOLUTE) 0.2 0.0 - 0.4 x10E3/uL   Basophils Absolute 0.0 0.0 - 0.2 x10E3/uL   Immature Granulocytes 0 Not Estab. %   Immature Grans (Abs) 0.0 0.0 - 0.1 x10E3/uL  Bayer DCA Hb A1c Waived  Result Value Ref Range   HB A1C (BAYER DCA - WAIVED) 7.2 (H) <7.0 %  Lipid panel  Result Value Ref Range   Cholesterol, Total 140 100 - 199 mg/dL   Triglycerides 130 0 - 149 mg/dL   HDL 50 >39 mg/dL   VLDL Cholesterol Cal 26 5 - 40 mg/dL   LDL Calculated 64 0 - 99 mg/dL   Chol/HDL Ratio 2.8 0.0 - 4.4 ratio      Assessment & Plan:   1. Pure hypercholesterolemia - CMP14+EGFR - CBC with Differential/Platelet - Lipid panel - Bayer DCA Hb A1c Waived  2. Type 2 diabetes mellitus without complication, without long-term current use of insulin (HCC) - CMP14+EGFR - CBC with Differential/Platelet - Lipid panel - Bayer DCA Hb A1c Waived   Continue all other maintenance medications as listed above.  Follow up plan: Return in about 3 months (around 04/18/2018) for recheck.  Educational handout given for [place] PA-C [place] [address]  [place], [place] [address] [phone]   01/16/2018, 11:34 AM

## 2018-01-17 ENCOUNTER — Ambulatory Visit: Payer: Medicare Other | Admitting: Physician Assistant

## 2018-01-17 LAB — CMP14+EGFR
A/G RATIO: 1.9 (ref 1.2–2.2)
ALT: 12 IU/L (ref 0–32)
AST: 17 IU/L (ref 0–40)
Albumin: 4.2 g/dL (ref 3.5–4.7)
Alkaline Phosphatase: 86 IU/L (ref 39–117)
BILIRUBIN TOTAL: 0.4 mg/dL (ref 0.0–1.2)
BUN / CREAT RATIO: 16 (ref 12–28)
BUN: 12 mg/dL (ref 8–27)
CHLORIDE: 102 mmol/L (ref 96–106)
CO2: 23 mmol/L (ref 20–29)
Calcium: 9.3 mg/dL (ref 8.7–10.3)
Creatinine, Ser: 0.74 mg/dL (ref 0.57–1.00)
GFR calc non Af Amer: 75 mL/min/{1.73_m2} (ref >59)
GFR, EST AFRICAN AMERICAN: 86 mL/min/{1.73_m2} (ref >59)
GLOBULIN, TOTAL: 2.2 g/dL (ref 1.5–4.5)
Glucose: 90 mg/dL (ref 65–99)
Potassium: 4.4 mmol/L (ref 3.5–5.2)
SODIUM: 140 mmol/L (ref 134–144)
TOTAL PROTEIN: 6.4 g/dL (ref 6.0–8.5)

## 2018-01-17 LAB — CBC WITH DIFFERENTIAL/PLATELET
BASOS ABS: 0.1 10*3/uL (ref 0.0–0.2)
Basos: 1 %
EOS (ABSOLUTE): 0.2 10*3/uL (ref 0.0–0.4)
Eos: 3 %
Hematocrit: 39.8 % (ref 34.0–46.6)
Hemoglobin: 12.8 g/dL (ref 11.1–15.9)
Immature Grans (Abs): 0 10*3/uL (ref 0.0–0.1)
Immature Granulocytes: 0 %
LYMPHS ABS: 1.7 10*3/uL (ref 0.7–3.1)
Lymphs: 29 %
MCH: 29.3 pg (ref 26.6–33.0)
MCHC: 32.2 g/dL (ref 31.5–35.7)
MCV: 91 fL (ref 79–97)
MONOCYTES: 11 %
Monocytes Absolute: 0.6 10*3/uL (ref 0.1–0.9)
NEUTROS ABS: 3.2 10*3/uL (ref 1.4–7.0)
Neutrophils: 56 %
Platelets: 255 10*3/uL (ref 150–450)
RBC: 4.37 x10E6/uL (ref 3.77–5.28)
RDW: 13.6 % (ref 12.3–15.4)
WBC: 5.8 10*3/uL (ref 3.4–10.8)

## 2018-01-17 LAB — LIPID PANEL
Chol/HDL Ratio: 4.3 ratio (ref 0.0–4.4)
Cholesterol, Total: 205 mg/dL — ABNORMAL HIGH (ref 100–199)
HDL: 48 mg/dL (ref >39)
LDL CALC: 127 mg/dL — AB (ref 0–99)
TRIGLYCERIDES: 151 mg/dL — AB (ref 0–149)
VLDL Cholesterol Cal: 30 mg/dL (ref 5–40)

## 2018-01-28 ENCOUNTER — Encounter: Payer: Self-pay | Admitting: Family Medicine

## 2018-01-28 ENCOUNTER — Ambulatory Visit (INDEPENDENT_AMBULATORY_CARE_PROVIDER_SITE_OTHER): Payer: Medicare Other | Admitting: Family Medicine

## 2018-01-28 VITALS — BP 138/75 | HR 76 | Temp 97.2°F | Ht 63.0 in | Wt 134.0 lb

## 2018-01-28 DIAGNOSIS — M25561 Pain in right knee: Secondary | ICD-10-CM | POA: Diagnosis not present

## 2018-01-28 MED ORDER — CELECOXIB 100 MG PO CAPS: 100.0000 mg | ORAL_CAPSULE | Freq: Two times a day (BID) | ORAL | 0 refills | Status: DC

## 2018-01-28 NOTE — Patient Instructions (Addendum)
I have prescribed you Celebrex to use twice a day if needed for pain and swelling.  Take this with food and plenty of water.  There is a risk of GI bleed with use of any NSAID medication.  If you develop any nausea, vomiting, dark or bloody stools, please discontinue the Celebrex immediately and contact your doctor.  I referred you to the orthopedic specialist.  You will receive a call with instructions/an appointment.  For now, rest, elevate your leg and apply ice to decrease swelling.  You have prescribed a nonsteroidal anti-inflammatory drug (NSAID) today. This will help with your pain and inflammation. Please do not take any other NSAIDs (ibuprofen/Motrin/Advil, naproxen/Aleve, meloxicam/Mobic, Voltaren/diclofenac). Please make sure to eat a meal when taking this medication.   Caution:  If you have a history of acid reflux/indigestion, I recommend that you take an antacid (such as Prilosec, Prevacid) daily while on the NSAID.  If you have a history of bleeding disorder, gastric ulcer, are on a blood thinner (like warfarin/Coumadin, Xarelto, Eliquis, etc) please do not take NSAID.  If you have ever had a heart attack, you should not take NSAIDs without a physician's supervision.  Knee Pain, Adult Many things can cause knee pain. The pain often goes away on its own with time and rest. If the pain does not go away, tests may be done to find out what is causing the pain. Follow these instructions at home: Activity  Rest your knee.  Do not do things that cause pain.  Avoid activities where both feet leave the ground at the same time (high-impact activities). Examples are running, jumping rope, and doing jumping jacks. General instructions  Take medicines only as told by your doctor.  Raise (elevate) your knee when you are resting. Make sure your knee is higher than your heart.  Sleep with a pillow under your knee.  If told, put ice on the knee: ? Put ice in a plastic bag. ? Place a  towel between your skin and the bag. ? Leave the ice on for 20 minutes, 2-3 times a day.  Ask your doctor if you should wear an elastic knee support.  Lose weight if you are overweight. Being overweight can make your knee hurt more.  Do not use any tobacco products. These include cigarettes, chewing tobacco, or electronic cigarettes. If you need help quitting, ask your doctor. Smoking may slow down healing. Contact a doctor if:  The pain does not stop.  The pain changes or gets worse.  You have a fever along with knee pain.  Your knee gives out or locks up.  Your knee swells, and becomes worse. Get help right away if:  Your knee feels warm.  You cannot move your knee.  You have very bad knee pain.  You have chest pain.  You have trouble breathing. Summary  Many things can cause knee pain. The pain often goes away on its own with time and rest.  Avoid activities that put stress on your knee. These include running and jumping rope.  Get help right away if you cannot move your knee, or if your knee feels warm, or if you have trouble breathing. This information is not intended to replace advice given to you by your health care provider. Make sure you discuss any questions you have with your health care provider. Document Released: 09/07/2008 Document Revised: 06/05/2016 Document Reviewed: 06/05/2016 Elsevier Interactive Patient Education  2017 [employer]

## 2018-01-28 NOTE — Progress Notes (Signed)
Subjective: CC: knee pain PCP: [name], [name], PA-C ZOX:WRUEHPI:Julie Riley is a 84 y.o. female presenting to clinic today for:  1. Knee pain Patient reports a 1 day history of right-sided knee pain.  She notes that it feels like it wants to give way.  She notes associated swelling.  She has not used any treatments for symptoms.  No history of knee problems or surgeries in the past.  She notes the pain is worse with walking.  She points to the lateral aspect of her knee as the source of pain.  No locking or popping.  No preceding injury.  She is normally ambulatory without assistance.  Medical history significant for history of atrial fibrillation on aspirin 325 daily.  No numbness or tingling.   ROS: Per HPI  Allergies  Allergen Reactions  . Amoxicillin   . Penicillins   . Xarelto [Rivaroxaban] Other (See Comments)    Severe diarrhea   Past Medical History:  Diagnosis Date  . CAD (coronary artery disease)    nonobsturctive.   . Carotid stenosis    Less than 50% bilateral ICA stenosis, 2008.  . Diabetes mellitus (HCC)   . HLD (hyperlipidemia)   . HTN (hypertension)     Current Outpatient Medications:  .  aspirin 325 MG EC tablet, Take 325 mg by mouth daily., Disp: , Rfl:  .  atorvastatin (LIPITOR) 10 MG tablet, Take 1 tablet (10 mg total) by mouth daily., Disp: 90 tablet, Rfl: 3 .  Cholecalciferol (VITAMIN D-3) 5000 units TABS, Take 1 tablet by mouth daily., Disp: 30 tablet, Rfl:  .  diltiazem (CARDIZEM) 30 MG tablet, Take 1 tablet (30 mg total) by mouth 2 (two) times daily., Disp: 180 tablet, Rfl: 3 .  enalapril (VASOTEC) 20 MG tablet, Take 1 tablet (20 mg total) by mouth 2 (two) times daily., Disp: 180 tablet, Rfl: 3 .  fluconazole (DIFLUCAN) 150 MG tablet, Take 1 tablet (150 mg total) by mouth every three (3) days as needed., Disp: 3 tablet, Rfl: 0 .  gabapentin (NEURONTIN) 300 MG capsule, TAKE 1 CAPSULE BY MOUTH ONCE DAILY, Disp: 90 capsule, Rfl: 0 .  glucose blood test  strip, Use to check blood sugars two times daily, Disp: 100 each, Rfl: 2 .  ketoconazole (NIZORAL) 2 % cream, Apply 1 application topically daily., Disp: 15 g, Rfl: 0 .  metFORMIN (GLUCOPHAGE) 500 MG tablet, Take 1 tablet (500 mg total) by mouth 2 (two) times daily with a meal., Disp: 180 tablet, Rfl: 3 .  nystatin (MYCOSTATIN/NYSTOP) powder, Apply topically 4 (four) times daily., Disp: 60 g, Rfl: 2 .  RELION LANCETS MICRO-THIN 33G MISC, Use to check blood sugars two times daily, Disp: 100 each, Rfl: 2 .  tolterodine (DETROL LA) 4 MG 24 hr capsule, Take 1 capsule (4 mg total) by mouth daily., Disp: 90 capsule, Rfl: 3 Social History   Socioeconomic History  . Marital status: Married    Spouse name: Not on file  . Number of children: Not on file  . Years of education: Not on file  . Highest education level: Not on file  Occupational History  . Not on file  Social Needs  . Financial resource strain: Not on file  . Food insecurity:    Worry: Not on file    Inability: Not on file  . Transportation needs:    Medical: Not on file    Non-medical: Not on file  Tobacco Use  . Smoking status: Never Smoker  .  Smokeless tobacco: Never Used  Substance and Sexual Activity  . Alcohol use: No  . Drug use: No  . Sexual activity: Not on file  Lifestyle  . Physical activity:    Days per week: Not on file    Minutes per session: Not on file  . Stress: Not on file  Relationships  . Social connections:    Talks on phone: Not on file    Gets together: Not on file    Attends religious service: Not on file    Active member of club or organization: Not on file    Attends meetings of clubs or organizations: Not on file    Relationship status: Not on file  . Intimate partner violence:    Fear of current or ex partner: Not on file    Emotionally abused: Not on file    Physically abused: Not on file    Forced sexual activity: Not on file  Other Topics Concern  . Not on file  Social History  Narrative  . Not on file   Family History  Problem Relation Age of Onset  . Heart attack Father        cause of death    Objective: Office vital signs reviewed. BP 138/75   Pulse 76   Temp (!) 97.2 F (36.2 C) (Oral)   Ht 5\' 3"  (1.6 m)   Wt 134 lb (60.8 kg)   BMI 23.74 kg/m   Physical Examination:  General: Awake, alert, well nourished, No acute distress Extremities: warm, well perfused, No edema, cyanosis or clubbing; +2 pulses bilaterally MSK: gait not assessed 2/2 pain.  She arrives in wheelchair.  Right knee: Moderate soft tissue swelling with mild joint effusion noted.  Minimally increase in warmth of the knee over the area of joint effusion.  No associated erythema or discoloration.  She has no tenderness to palpation to the patella, patellar tendon or quad tendon.  She does have tenderness to palpation along the right lateral aspect of the knee.  No posterior popliteal masses or tenderness.  No ligament is laxity.  Thessaly maneuver was not tested secondary to pain and inability to stand. Skin: dry; intact; no rashes or lesions Neuro: 3+/5 knee extensor/flexor strength. light touch sensation grossly intact  Assessment/ Plan: 84 y.o. female    1. Acute pain of right knee Concern for possible meniscal injury.  No evidence of joint infection at this point.  Given difficulty with extension and flexion, I have recommended that she see an orthopedist for further evaluation.  In the interim, I will place her on Celebrex given concomitant use of aspirin for atrial fibrillation.  I did caution potential for GI bleed.  Home care instruction's were reviewed with the patient.  Encouraged RICE.  She was placed in a brace prior to discharge from the office.  Reasons for return and urgent evaluation in the emergency department discussed.  Patient was good understanding will follow-up as needed. - Ambulatory referral to Orthopedic Surgery   Orders Placed This Encounter  Procedures  .  Ambulatory referral to Orthopedic Surgery    Referral Priority:   Urgent    Referral Type:   Surgical    Referral Reason:   Specialty Services Required    Requested Specialty:   Orthopedic Surgery    Number of Visits Requested:   1   Meds ordered this encounter  Medications  . celecoxib (CELEBREX) 100 MG capsule    Sig: Take 1 capsule (100 mg total) by  mouth 2 (two) times daily.    Dispense:  30 capsule    Refill:  0     Erasmus Bistline Hulen Skains, DO [facility] [phone]

## 2018-02-05 ENCOUNTER — Ambulatory Visit (INDEPENDENT_AMBULATORY_CARE_PROVIDER_SITE_OTHER): Payer: Self-pay

## 2018-02-05 ENCOUNTER — Ambulatory Visit (INDEPENDENT_AMBULATORY_CARE_PROVIDER_SITE_OTHER): Payer: Medicare Other | Admitting: Orthopaedic Surgery

## 2018-02-05 ENCOUNTER — Encounter (INDEPENDENT_AMBULATORY_CARE_PROVIDER_SITE_OTHER): Payer: Self-pay | Admitting: Orthopaedic Surgery

## 2018-02-05 VITALS — BP 126/65 | HR 72 | Ht 61.0 in | Wt 133.0 lb

## 2018-02-05 DIAGNOSIS — G8929 Other chronic pain: Secondary | ICD-10-CM | POA: Diagnosis not present

## 2018-02-05 DIAGNOSIS — M25561 Pain in right knee: Secondary | ICD-10-CM | POA: Diagnosis not present

## 2018-02-05 MED ORDER — METHYLPREDNISOLONE ACETATE 40 MG/ML IJ SUSP
80.0000 mg | INTRAMUSCULAR | Status: AC | PRN
  Administered 2018-02-05: 80 mg

## 2018-02-05 MED ORDER — BUPIVACAINE HCL 0.5 % IJ SOLN
2.0000 mL | INTRAMUSCULAR | Status: AC | PRN
  Administered 2018-02-05: 2 mL via INTRA_ARTICULAR

## 2018-02-05 MED ORDER — LIDOCAINE HCL 1 % IJ SOLN
2.0000 mL | INTRAMUSCULAR | Status: AC | PRN
  Administered 2018-02-05: 2 mL

## 2018-02-05 NOTE — Progress Notes (Signed)
Office Visit Note   Patient: Julie Riley           Date of Birth: 01/19/1932           MRN: [id number] Visit Date: 02/05/2018              Requested by: Raliegh IpGottschalk, Ashly M, DO [address] [place], [place] [id number] PCP: [name], [name], PA-C   Assessment & Plan: Visit Diagnoses:  1. Chronic pain of right knee     Plan: Primary osteoarthritis right knee.  Long discussion with family regarding the problem and possible recurrent symptoms in the future.  Will inject with cortisone after aspiration  Follow-Up Instructions: Return if symptoms worsen or fail to improve.   Orders:  Orders Placed This Encounter  Procedures  . Large Joint Inj: R knee  . XR KNEE 3 VIEW RIGHT   No orders of the defined types were placed in this encounter.     Procedures: Large Joint Inj: R knee on 02/05/2018 12:48 PM Indications: pain and diagnostic evaluation Details: 25 G 1.5 in needle, anteromedial approach  Arthrogram: No  Medications: 2 mL lidocaine 1 %; 2 mL bupivacaine 0.5 %; 80 mg methylPREDNISolone acetate 40 MG/ML Procedure, treatment alternatives, risks and benefits explained, specific risks discussed. Consent was given by the patient. Immediately prior to procedure a time out was called to verify the correct patient, procedure, equipment, support staff and site/side marked as required. Patient was prepped and draped in the usual sterile fashion.       Clinical Data: No additional findings.   Subjective: Chief Complaint  Patient presents with  . New Patient (Initial Visit)    R KNEE  PAIN, SWOLLEN AND COULDNT BEAR WEIGHT LAST WEEK BUT BETTER NOW,  GIVES AWAY. USING CANE HINGED KNEE BRACE   Julie Riley is 84 years old accompanied by her daughter who works at [employer] and her husband.  Julie Riley is being evaluated for problems referable to her right knee.  Julie Riley is had trouble off and on for "long time" but had acute episode of right knee pain last week when it became  "swollen".  It is better now but still gives way.  Julie Riley is been wearing a hinged knee brace and using a cane.  Julie Riley is also tried Tylenol arthritis with some relief.  Last week Julie Riley had difficulty bearing weight but "better today".  Denies any history of injury or trauma.  Has not had any fever chills or other significant joint complaints  HPI  Review of Systems  Constitutional: Positive for fatigue. Negative for fever.  HENT: Negative for ear pain.   Eyes: Negative for pain.  Respiratory: Negative for cough and shortness of breath.   Cardiovascular: Positive for leg swelling.  Gastrointestinal: Negative for constipation and diarrhea.  Genitourinary: Negative for difficulty urinating.  Musculoskeletal: Negative for back pain and neck pain.  Skin: Negative for rash.  Allergic/Immunologic: Negative for food allergies.  Neurological: Positive for weakness. Negative for numbness.  Hematological: Bruises/bleeds easily.  Psychiatric/Behavioral: Positive for sleep disturbance.     Objective: Vital Signs: BP 126/65 (BP Location: Left Arm, Patient Position: Sitting, Cuff Size: Normal)   Pulse 72   Ht 5\' 1"  (1.549 m)   Wt 133 lb (60.3 kg)   BMI 25.13 kg/m   Physical Exam  Constitutional: Julie Riley is oriented to person, place, and time. Julie Riley appears well-developed and well-nourished.  HENT:  Mouth/Throat: Oropharynx is clear and moist.  Eyes: Pupils are equal,  round, and reactive to light. EOM are normal.  Pulmonary/Chest: Effort normal.  Neurological: Julie Riley is alert and oriented to person, place, and time.  Skin: Skin is warm and dry.  Psychiatric: Julie Riley has a normal mood and affect. Her behavior is normal.    Ortho Exam.  Comfortable sitting.  Exam limited to right knee.  There is a positive effusion.  No significant increased heat.  Some pain with patellar crepitation and patella compression.  No opening with varus valgus stress.  Full extension a little over 100 degrees of flexion.  No popliteal  pain.  No calf pain.  No distal edema.  +1 pulses.  Good capillary refill to toes. I aspirated about 24 cc of clear yellow fluid from her knee and then injected cortisone with significant relief of her pain  Specialty Comments:  No specialty comments available.  Imaging: Xr Knee 3 View Right  Result Date: 02/05/2018 Films of the right knee were obtained in several projections standing.  There are some degenerative changes in the medial lateral compartment with irregularity of the femoral joint surface and small peripheral osteophytes.  Ectopic calcification.  The joint spaces are relatively well-maintained.  There is considerable degenerative change about the patellofemoral joint with bone-on-bone in the lateral patellofemoral articulation.    PMFS History: Patient Active Problem List   Diagnosis Date Noted  . Essential hypertension 07/18/2016  . History of atrial fibrillation 07/18/2016  . Type 2 diabetes mellitus without complication, without long-term current use of insulin (HCC) 07/18/2016  . Neuropathy 07/18/2016  . Vitamin D deficiency 07/18/2016  . OAB (overactive bladder) 07/18/2016  . Pure hypercholesterolemia 07/18/2016  . Carotid stenosis    Past Medical History:  Diagnosis Date  . CAD (coronary artery disease)    nonobsturctive.   . Carotid stenosis    Less than 50% bilateral ICA stenosis, 2008.  . Diabetes mellitus (HCC)   . HLD (hyperlipidemia)   . HTN (hypertension)     Family History  Problem Relation Age of Onset  . Heart attack Father        cause of death    Past Surgical History:  Procedure Laterality Date  . APPENDECTOMY    . CHOLECYSTECTOMY    . partial hysterectomy --- unknown     Social History   Occupational History  . Not on file  Tobacco Use  . Smoking status: Never Smoker  . Smokeless tobacco: Never Used  Substance and Sexual Activity  . Alcohol use: No  . Drug use: No  . Sexual activity: Not on file

## 2018-04-09 ENCOUNTER — Telehealth: Payer: Self-pay | Admitting: Physician Assistant

## 2018-04-09 DIAGNOSIS — G629 Polyneuropathy, unspecified: Secondary | ICD-10-CM

## 2018-04-09 MED ORDER — GABAPENTIN 300 MG PO CAPS: 300.0000 mg | ORAL_CAPSULE | Freq: Every day | ORAL | 0 refills | Status: DC

## 2018-04-09 NOTE — Telephone Encounter (Signed)
What is the name of the medication? gabapentin (NEURONTIN) 300 MG capsule  Have you contacted your pharmacy to request a refill? no  Which pharmacy would you like this sent to? [facility]   Patient notified that their request is being sent to the clinical staff for review and that they should receive a call once it is complete. If they do not receive a call within 24 hours they can check with their pharmacy or our office.

## 2018-04-09 NOTE — Telephone Encounter (Signed)
rx sent to pharmacy and message left for patient.

## 2018-04-15 DIAGNOSIS — Z23 Encounter for immunization: Secondary | ICD-10-CM | POA: Diagnosis not present

## 2018-04-22 ENCOUNTER — Encounter: Payer: Self-pay | Admitting: Physician Assistant

## 2018-04-22 ENCOUNTER — Ambulatory Visit: Payer: Medicare Other | Admitting: Physician Assistant

## 2018-04-22 DIAGNOSIS — N3281 Overactive bladder: Secondary | ICD-10-CM

## 2018-04-22 DIAGNOSIS — G629 Polyneuropathy, unspecified: Secondary | ICD-10-CM | POA: Diagnosis not present

## 2018-04-22 DIAGNOSIS — Z8679 Personal history of other diseases of the circulatory system: Secondary | ICD-10-CM

## 2018-04-22 DIAGNOSIS — I10 Essential (primary) hypertension: Secondary | ICD-10-CM | POA: Diagnosis not present

## 2018-04-22 DIAGNOSIS — E7800 Pure hypercholesterolemia, unspecified: Secondary | ICD-10-CM

## 2018-04-22 DIAGNOSIS — E119 Type 2 diabetes mellitus without complications: Secondary | ICD-10-CM

## 2018-04-22 MED ORDER — ATORVASTATIN CALCIUM 10 MG PO TABS: 10.0000 mg | ORAL_TABLET | Freq: Every day | ORAL | 1 refills | Status: DC

## 2018-04-22 MED ORDER — DILTIAZEM HCL 30 MG PO TABS: 30.0000 mg | ORAL_TABLET | Freq: Two times a day (BID) | ORAL | 1 refills | Status: DC

## 2018-04-22 MED ORDER — METFORMIN HCL 500 MG PO TABS: 500.0000 mg | ORAL_TABLET | Freq: Two times a day (BID) | ORAL | 1 refills | Status: DC

## 2018-04-22 MED ORDER — TOLTERODINE TARTRATE ER 4 MG PO CP24
4.0000 mg | ORAL_CAPSULE | Freq: Every day | ORAL | 1 refills | Status: DC
Start: 2018-04-22 — End: 2018-10-26

## 2018-04-22 MED ORDER — ENALAPRIL MALEATE 20 MG PO TABS: 20.0000 mg | ORAL_TABLET | Freq: Two times a day (BID) | ORAL | 1 refills | Status: DC

## 2018-04-22 MED ORDER — GABAPENTIN 300 MG PO CAPS: 300.0000 mg | ORAL_CAPSULE | Freq: Every day | ORAL | 1 refills | Status: DC

## 2018-04-22 MED ORDER — GLUCOSE BLOOD VI STRP: ORAL_STRIP | 11 refills | Status: DC

## 2018-04-22 NOTE — Progress Notes (Signed)
BP 137/70   Pulse 67   Ht '5\' 1"'  (1.549 m)   Wt 132 lb 12.8 oz (60.2 kg)   BMI 25.09 kg/m    Subjective:    Patient ID: Julie Riley, female    DOB: Dec 30, 1931, 84 y.o.   MRN: [id number]  HPI: Julie Riley is a 84 y.o. female presenting on 04/22/2018 for Hypertension (3 month ) and Diabetes  This patient comes in for periodic recheck on medications and conditions including hyperlpidemia, hypertension, a fib history, neuropathy, type 2 diabetes controlled, OAB.Marland Kitchen   All medications are reviewed today. There are no reports of any problems with the medications. All of the medical conditions are reviewed and updated.  Lab work is reviewed and will be ordered as medically necessary. There are no new problems reported with today's visit.   Past Medical History:  Diagnosis Date  . CAD (coronary artery disease)    nonobsturctive.   . Carotid stenosis    Less than 50% bilateral ICA stenosis, 2008.  . Diabetes mellitus ([place])   . HLD (hyperlipidemia)   . HTN (hypertension)    Relevant past medical, surgical, family and social history reviewed and updated as indicated. Interim medical history since our last visit reviewed. Allergies and medications reviewed and updated. DATA REVIEWED: CHART IN EPIC  Family History reviewed for pertinent findings.  Review of Systems  Constitutional: Negative.   HENT: Negative.   Eyes: Negative.   Respiratory: Negative.   Gastrointestinal: Negative.   Genitourinary: Negative.     Allergies as of 04/22/2018      Reactions   Amoxicillin    Penicillins    Xarelto [rivaroxaban] Other (See Comments)   Severe diarrhea      Medication List        Accurate as of 04/22/18 11:03 PM. Always use your most recent med list.          aspirin 325 MG EC tablet Take 325 mg by mouth daily.   atorvastatin 10 MG tablet Commonly known as:  LIPITOR Take 1 tablet (10 mg total) by mouth daily.   celecoxib 100 MG capsule Commonly known as:   CELEBREX Take 1 capsule (100 mg total) by mouth 2 (two) times daily.   diltiazem 30 MG tablet Commonly known as:  CARDIZEM Take 1 tablet (30 mg total) by mouth 2 (two) times daily.   enalapril 20 MG tablet Commonly known as:  VASOTEC Take 1 tablet (20 mg total) by mouth 2 (two) times daily.   fluconazole 150 MG tablet Commonly known as:  DIFLUCAN Take 1 tablet (150 mg total) by mouth every three (3) days as needed.   gabapentin 300 MG capsule Commonly known as:  NEURONTIN Take 1 capsule (300 mg total) by mouth daily.   glucose blood test strip Use to check blood sugars two times daily   ketoconazole 2 % cream Commonly known as:  NIZORAL Apply 1 application topically daily.   metFORMIN 500 MG tablet Commonly known as:  GLUCOPHAGE Take 1 tablet (500 mg total) by mouth 2 (two) times daily with a meal.   nystatin powder Commonly known as:  MYCOSTATIN/NYSTOP Apply topically 4 (four) times daily.   RELION LANCETS MICRO-THIN 33G Misc Use to check blood sugars two times daily   tolterodine 4 MG 24 hr capsule Commonly known as:  DETROL LA Take 1 capsule (4 mg total) by mouth daily.   Vitamin D-3 5000 units Tabs Take 1 tablet by mouth daily.  Objective:    BP 137/70   Pulse 67   Ht '5\' 1"'  (1.549 m)   Wt 132 lb 12.8 oz (60.2 kg)   BMI 25.09 kg/m   Allergies  Allergen Reactions  . Amoxicillin   . Penicillins   . Xarelto [Rivaroxaban] Other (See Comments)    Severe diarrhea    Wt Readings from Last 3 Encounters:  04/22/18 132 lb 12.8 oz (60.2 kg)  02/05/18 133 lb (60.3 kg)  01/28/18 134 lb (60.8 kg)    Physical Exam  Constitutional: She is oriented to person, place, and time. She appears well-developed and well-nourished.  HENT:  Head: Normocephalic and atraumatic.  Eyes: Pupils are equal, round, and reactive to light. Conjunctivae and EOM are normal.  Cardiovascular: Normal rate, regular rhythm, normal heart sounds and intact distal pulses.   Pulmonary/Chest: Effort normal and breath sounds normal.  Abdominal: Soft. Bowel sounds are normal.  Neurological: She is alert and oriented to person, place, and time. She has normal reflexes.  Skin: Skin is warm and dry. No rash noted.  Psychiatric: She has a normal mood and affect. Her behavior is normal. Judgment and thought content normal.    Results for orders placed or performed in visit on 01/16/18  CMP14+EGFR  Result Value Ref Range   Glucose 90 65 - 99 mg/dL   BUN 12 8 - 27 mg/dL   Creatinine, Ser 0.74 0.57 - 1.00 mg/dL   GFR calc non Af Amer 75 >59 mL/min/1.73   GFR calc Af Amer 86 >59 mL/min/1.73   BUN/Creatinine Ratio 16 12 - 28   Sodium 140 134 - 144 mmol/L   Potassium 4.4 3.5 - 5.2 mmol/L   Chloride 102 96 - 106 mmol/L   CO2 23 20 - 29 mmol/L   Calcium 9.3 8.7 - 10.3 mg/dL   Total Protein 6.4 6.0 - 8.5 g/dL   Albumin 4.2 3.5 - 4.7 g/dL   Globulin, Total 2.2 1.5 - 4.5 g/dL   Albumin/Globulin Ratio 1.9 1.2 - 2.2   Bilirubin Total 0.4 0.0 - 1.2 mg/dL   Alkaline Phosphatase 86 39 - 117 IU/L   AST 17 0 - 40 IU/L   ALT 12 0 - 32 IU/L  CBC with Differential/Platelet  Result Value Ref Range   WBC 5.8 3.4 - 10.8 x10E3/uL   RBC 4.37 3.77 - 5.28 x10E6/uL   Hemoglobin 12.8 11.1 - 15.9 g/dL   Hematocrit 39.8 34.0 - 46.6 %   MCV 91 79 - 97 fL   MCH 29.3 26.6 - 33.0 pg   MCHC 32.2 31.5 - 35.7 g/dL   RDW 13.6 12.3 - 15.4 %   Platelets 255 150 - 450 x10E3/uL   Neutrophils 56 Not Estab. %   Lymphs 29 Not Estab. %   Monocytes 11 Not Estab. %   Eos 3 Not Estab. %   Basos 1 Not Estab. %   Neutrophils Absolute 3.2 1.4 - 7.0 x10E3/uL   Lymphocytes Absolute 1.7 0.7 - 3.1 x10E3/uL   Monocytes Absolute 0.6 0.1 - 0.9 x10E3/uL   EOS (ABSOLUTE) 0.2 0.0 - 0.4 x10E3/uL   Basophils Absolute 0.1 0.0 - 0.2 x10E3/uL   Immature Granulocytes 0 Not Estab. %   Immature Grans (Abs) 0.0 0.0 - 0.1 x10E3/uL  Lipid panel  Result Value Ref Range   Cholesterol, Total 205 (H) 100 - 199 mg/dL    Triglycerides 151 (H) 0 - 149 mg/dL   HDL 48 >39 mg/dL   VLDL Cholesterol Cal 30  5 - 40 mg/dL   LDL Calculated 127 (H) 0 - 99 mg/dL   Chol/HDL Ratio 4.3 0.0 - 4.4 ratio  Bayer DCA Hb A1c Waived  Result Value Ref Range   HB A1C (BAYER DCA - WAIVED) 6.6 <7.0 %      Assessment & Plan:   1. Pure hypercholesterolemia - atorvastatin (LIPITOR) 10 MG tablet; Take 1 tablet (10 mg total) by mouth daily.  Dispense: 90 tablet; Refill: 1  2. Essential hypertension - diltiazem (CARDIZEM) 30 MG tablet; Take 1 tablet (30 mg total) by mouth 2 (two) times daily.  Dispense: 180 tablet; Refill: 1 - enalapril (VASOTEC) 20 MG tablet; Take 1 tablet (20 mg total) by mouth 2 (two) times daily.  Dispense: 180 tablet; Refill: 1  3. History of atrial fibrillation - diltiazem (CARDIZEM) 30 MG tablet; Take 1 tablet (30 mg total) by mouth 2 (two) times daily.  Dispense: 180 tablet; Refill: 1  4. Neuropathy - gabapentin (NEURONTIN) 300 MG capsule; Take 1 capsule (300 mg total) by mouth daily.  Dispense: 90 capsule; Refill: 1  5. Type 2 diabetes mellitus without complication, without long-term current use of insulin (HCC) - metFORMIN (GLUCOPHAGE) 500 MG tablet; Take 1 tablet (500 mg total) by mouth 2 (two) times daily with a meal.  Dispense: 180 tablet; Refill: 1  6. OAB (overactive bladder) - tolterodine (DETROL LA) 4 MG 24 hr capsule; Take 1 capsule (4 mg total) by mouth daily.  Dispense: 90 capsule; Refill: 1   Continue all other maintenance medications as listed above.  Follow up plan: Return in about 6 months (around 10/22/2018) for recheck and labs.  Educational handout given for Willey PA-C [facility] [address]  [place], [place] [address] [phone]   04/22/2018, 11:03 PM

## 2018-09-03 DIAGNOSIS — E113291 Type 2 diabetes mellitus with mild nonproliferative diabetic retinopathy without macular edema, right eye: Secondary | ICD-10-CM | POA: Diagnosis not present

## 2018-09-03 DIAGNOSIS — E113392 Type 2 diabetes mellitus with moderate nonproliferative diabetic retinopathy without macular edema, left eye: Secondary | ICD-10-CM | POA: Diagnosis not present

## 2018-09-03 DIAGNOSIS — Z7984 Long term (current) use of oral hypoglycemic drugs: Secondary | ICD-10-CM | POA: Diagnosis not present

## 2018-09-03 DIAGNOSIS — H43813 Vitreous degeneration, bilateral: Secondary | ICD-10-CM | POA: Diagnosis not present

## 2018-09-03 LAB — HM DIABETES EYE EXAM

## 2018-10-24 ENCOUNTER — Other Ambulatory Visit: Payer: Self-pay

## 2018-10-24 ENCOUNTER — Ambulatory Visit (INDEPENDENT_AMBULATORY_CARE_PROVIDER_SITE_OTHER): Payer: Medicare Other | Admitting: Physician Assistant

## 2018-10-24 DIAGNOSIS — I10 Essential (primary) hypertension: Secondary | ICD-10-CM | POA: Diagnosis not present

## 2018-10-24 DIAGNOSIS — G629 Polyneuropathy, unspecified: Secondary | ICD-10-CM | POA: Diagnosis not present

## 2018-10-24 DIAGNOSIS — E7800 Pure hypercholesterolemia, unspecified: Secondary | ICD-10-CM

## 2018-10-24 DIAGNOSIS — Z8679 Personal history of other diseases of the circulatory system: Secondary | ICD-10-CM

## 2018-10-24 DIAGNOSIS — N3281 Overactive bladder: Secondary | ICD-10-CM

## 2018-10-24 DIAGNOSIS — E119 Type 2 diabetes mellitus without complications: Secondary | ICD-10-CM

## 2018-10-24 MED ORDER — MIRABEGRON ER 25 MG PO TB24: 25.0000 mg | ORAL_TABLET | Freq: Every day | ORAL | 5 refills | Status: DC

## 2018-10-24 MED ORDER — ENALAPRIL MALEATE 20 MG PO TABS: 20.0000 mg | ORAL_TABLET | Freq: Two times a day (BID) | ORAL | 3 refills | Status: DC

## 2018-10-24 MED ORDER — DILTIAZEM HCL 30 MG PO TABS: 30.0000 mg | ORAL_TABLET | Freq: Two times a day (BID) | ORAL | 3 refills | Status: DC

## 2018-10-24 MED ORDER — ATORVASTATIN CALCIUM 10 MG PO TABS: 10.0000 mg | ORAL_TABLET | Freq: Every day | ORAL | 3 refills | Status: DC

## 2018-10-24 MED ORDER — GABAPENTIN 300 MG PO CAPS: 300.0000 mg | ORAL_CAPSULE | Freq: Every day | ORAL | 3 refills | Status: DC

## 2018-10-24 MED ORDER — METFORMIN HCL 500 MG PO TABS: 500.0000 mg | ORAL_TABLET | Freq: Two times a day (BID) | ORAL | 3 refills | Status: DC

## 2018-10-26 ENCOUNTER — Encounter: Payer: Self-pay | Admitting: Physician Assistant

## 2018-10-26 NOTE — Progress Notes (Signed)
Telephone visit  Subjective: Julie Riley chronic medical conditions PCP: Terald Sleeper, PA-C [facility] S Julie Riley is a 85 y.o. female calls for telephone consult today. Patient provides verbal consent for consult held via phone.  Patient is identified with 2 separate identifiers.  At this time the entire area is on COVID-19 social distancing and stay home orders are in place.  Patient is of higher risk and therefore we are performing this by a virtual method.  Location of patient: home Location of provider: [facility] Others present for call: husband  This patient is having telephone presents for recheck on her chronic medical conditions.  She reports that she is doing well overall.  She is not been able to take the overactive bladder medication but it did not help.  We have discussed Myrbetriq as a possibility.  She has never taken this medication.  She has tried and failed Detrol LA and oxybutynin. She reports that otherwise she is doing quite well.  She does have some refills on many of her she does have diabetes, hypercholesterolemia, history of atrial fibrillation   ROS: Per HPI  Allergies  Allergen Reactions  . Amoxicillin   . Penicillins   . Xarelto [Rivaroxaban] Other (See Comments)    Severe diarrhea   Past Medical History:  Diagnosis Date  . CAD (coronary artery disease)    nonobsturctive.   . Carotid stenosis    Less than 50% bilateral ICA stenosis, 2008.  . Diabetes mellitus ([place])   . HLD (hyperlipidemia)   . HTN (hypertension)     Current Outpatient Medications:  .  aspirin 325 MG EC tablet, Take 325 mg by mouth daily., Disp: , Rfl:  .  atorvastatin (LIPITOR) 10 MG tablet, Take 1 tablet (10 mg total) by mouth daily., Disp: 90 tablet, Rfl: 3 .  Cholecalciferol (VITAMIN D-3) 5000 units TABS, Take 1 tablet by mouth daily., Disp: 30 tablet, Rfl:  .  diltiazem (CARDIZEM) 30 MG tablet, Take 1 tablet (30 mg total) by mouth 2 (two) times daily., Disp: 180 tablet, Rfl:  3 .  enalapril (VASOTEC) 20 MG tablet, Take 1 tablet (20 mg total) by mouth 2 (two) times daily., Disp: 180 tablet, Rfl: 3 .  fluconazole (DIFLUCAN) 150 MG tablet, Take 1 tablet (150 mg total) by mouth every three (3) days as needed., Disp: 3 tablet, Rfl: 0 .  gabapentin (NEURONTIN) 300 MG capsule, Take 1 capsule (300 mg total) by mouth daily., Disp: 90 capsule, Rfl: 3 .  glucose blood test strip, Use to check blood sugars two times daily, Disp: 100 each, Rfl: 11 .  ketoconazole (NIZORAL) 2 % cream, Apply 1 application topically daily., Disp: 15 g, Rfl: 0 .  metFORMIN (GLUCOPHAGE) 500 MG tablet, Take 1 tablet (500 mg total) by mouth 2 (two) times daily with a meal., Disp: 180 tablet, Rfl: 3 .  mirabegron ER (MYRBETRIQ) 25 MG TB24 tablet, Take 1 tablet (25 mg total) by mouth daily., Disp: 30 tablet, Rfl: 5 .  nystatin (MYCOSTATIN/NYSTOP) powder, Apply topically 4 (four) times daily., Disp: 60 g, Rfl: 2 .  RELION LANCETS MICRO-THIN 33G MISC, Use to check blood sugars two times daily, Disp: 100 each, Rfl: 2  Assessment/ Plan: 85 y.o. female   1. Essential hypertension - enalapril (VASOTEC) 20 MG tablet; Take 1 tablet (20 mg total) by mouth 2 (two) times daily.  Dispense: 180 tablet; Refill: 3 - diltiazem (CARDIZEM) 30 MG tablet; Take 1 tablet (30 mg total) by mouth 2 (two)  times daily.  Dispense: 180 tablet; Refill: 3 - CBC with Differential/Platelet; Future - CMP14+EGFR; Future - Lipid panel; Future - Bayer DCA Hb A1c Waived; Future - Microalbumin / creatinine urine ratio; Future  2. History of atrial fibrillation - diltiazem (CARDIZEM) 30 MG tablet; Take 1 tablet (30 mg total) by mouth 2 (two) times daily.  Dispense: 180 tablet; Refill: 3  3. Neuropathy - gabapentin (NEURONTIN) 300 MG capsule; Take 1 capsule (300 mg total) by mouth daily.  Dispense: 90 capsule; Refill: 3  4. Type 2 diabetes mellitus without complication, without long-term current use of insulin (HCC) - metFORMIN  (GLUCOPHAGE) 500 MG tablet; Take 1 tablet (500 mg total) by mouth 2 (two) times daily with a meal.  Dispense: 180 tablet; Refill: 3 - Bayer DCA Hb A1c Waived; Future - Microalbumin / creatinine urine ratio; Future  5. OAB (overactive bladder) - mirabegron ER (MYRBETRIQ) 25 MG TB24 tablet; Take 1 tablet (25 mg total) by mouth daily.  Dispense: 30 tablet; Refill: 5  6. Pure hypercholesterolemia - atorvastatin (LIPITOR) 10 MG tablet; Take 1 tablet (10 mg total) by mouth daily.  Dispense: 90 tablet; Refill: 3 - Lipid panel; Future   Start time: 8:26 AM End time: 8:38 AM  Meds ordered this encounter  Medications  . enalapril (VASOTEC) 20 MG tablet    Sig: Take 1 tablet (20 mg total) by mouth 2 (two) times daily.    Dispense:  180 tablet    Refill:  3    Order Specific Question:   Supervising Provider    Answer:   Janora Norlander [[id number]]  . diltiazem (CARDIZEM) 30 MG tablet    Sig: Take 1 tablet (30 mg total) by mouth 2 (two) times daily.    Dispense:  180 tablet    Refill:  3    Order Specific Question:   Supervising Provider    Answer:   Janora Norlander [[id number]]  . gabapentin (NEURONTIN) 300 MG capsule    Sig: Take 1 capsule (300 mg total) by mouth daily.    Dispense:  90 capsule    Refill:  3    Order Specific Question:   Supervising Provider    Answer:   Janora Norlander [[id number]]  . metFORMIN (GLUCOPHAGE) 500 MG tablet    Sig: Take 1 tablet (500 mg total) by mouth 2 (two) times daily with a meal.    Dispense:  180 tablet    Refill:  3    Order Specific Question:   Supervising Provider    Answer:   Janora Norlander [[id number]]  . mirabegron ER (MYRBETRIQ) 25 MG TB24 tablet    Sig: Take 1 tablet (25 mg total) by mouth daily.    Dispense:  30 tablet    Refill:  5    Order Specific Question:   Supervising Provider    Answer:   Janora Norlander [[id number]]  . atorvastatin (LIPITOR) 10 MG tablet    Sig: Take 1 tablet (10 mg total) by mouth daily.    Dispense:   90 tablet    Refill:  3    Order Specific Question:   Supervising Provider    Answer:   Janora Norlander [[id number]]    Particia Nearing PA-C Nathalie [phone]

## 2018-11-11 ENCOUNTER — Telehealth: Payer: Self-pay | Admitting: Physician Assistant

## 2018-11-26 ENCOUNTER — Ambulatory Visit (INDEPENDENT_AMBULATORY_CARE_PROVIDER_SITE_OTHER): Payer: Medicare Other | Admitting: *Deleted

## 2018-11-26 ENCOUNTER — Other Ambulatory Visit: Payer: Self-pay

## 2018-11-26 VITALS — Ht 61.0 in | Wt 132.0 lb

## 2018-11-26 DIAGNOSIS — Z0000 Encounter for general adult medical examination without abnormal findings: Secondary | ICD-10-CM

## 2018-11-26 NOTE — Progress Notes (Signed)
MEDICARE ANNUAL WELLNESS VISIT  11/26/2018  Telephone Visit Disclaimer This Medicare AWV was conducted by telephone due to national recommendations for restrictions regarding the COVID-19 Pandemic (e.g. social distancing).  I verified, using two identifiers, that I am speaking with Julie Riley or their authorized healthcare agent. I discussed the limitations, risks, security, and privacy concerns of performing an evaluation and management service by telephone and the potential availability of an in-person appointment in the future. The patient expressed understanding and agreed to proceed.   Subjective:  Julie Riley is a 85 y.o. female patient of Remus Loffler, PA-C who had a Medicare Annual Wellness Visit today via telephone. Julie Riley is Retired and lives with their spouse. she has 4 living children. she reports that she is socially active and does interact with friends/family regularly. she is not physically active and enjoys sewing.  Patient Care Team: Julie Never as PCP - General (Physician Assistant)  Advanced Directives 11/26/2018  Does Patient Have a Medical Advance Directive? No  Would patient like information on creating a medical advance directive? Yes (MAU/Ambulatory/Procedural Areas - Information given)    Hospital Utilization Over the Past 12 Months: # of hospitalizations or ER visits: 0 # of surgeries: 0  Review of Systems    Patient reports that her overall health is unchanged compared to last year.  Patient Reported Readings (BP, Pulse, CBG, Weight, etc) none  Review of Systems: History obtained from chart review and the patient General ROS: negative  All other systems negative.  Pain Assessment Pain : No/denies pain     Current Medications & Allergies (verified) Allergies as of 11/26/2018      Reactions   Amoxicillin    Penicillins    Xarelto [rivaroxaban] Other (See Comments)   Severe diarrhea      Medication List       Accurate  as of November 26, 2018  9:04 AM. If you have any questions, ask your nurse or doctor.        aspirin 325 MG EC tablet Take 325 mg by mouth daily.   atorvastatin 10 MG tablet Commonly known as:  LIPITOR Take 1 tablet (10 mg total) by mouth daily.   diltiazem 30 MG tablet Commonly known as:  CARDIZEM Take 1 tablet (30 mg total) by mouth 2 (two) times daily.   enalapril 20 MG tablet Commonly known as:  VASOTEC Take 1 tablet (20 mg total) by mouth 2 (two) times daily.   fluconazole 150 MG tablet Commonly known as:  DIFLUCAN Take 1 tablet (150 mg total) by mouth every three (3) days as needed.   gabapentin 300 MG capsule Commonly known as:  NEURONTIN Take 1 capsule (300 mg total) by mouth daily.   glucose blood test strip Use to check blood sugars two times daily   ketoconazole 2 % cream Commonly known as:  NIZORAL Apply 1 application topically daily.   metFORMIN 500 MG tablet Commonly known as:  GLUCOPHAGE Take 1 tablet (500 mg total) by mouth 2 (two) times daily with a meal.   mirabegron ER 25 MG Tb24 tablet Commonly known as:  Myrbetriq Take 1 tablet (25 mg total) by mouth daily.   nystatin powder Commonly known as:  MYCOSTATIN/NYSTOP Apply topically 4 (four) times daily.   ReliOn Lancets Micro-Thin 33G Misc Use to check blood sugars two times daily   Vitamin D-3 125 MCG (5000 UT) Tabs Take 1 tablet by mouth daily.  History (reviewed): Past Medical History:  Diagnosis Date  . CAD (coronary artery disease)    nonobsturctive.   . Carotid stenosis    Less than 50% bilateral ICA stenosis, 2008.  . Diabetes mellitus (HCC)   . HLD (hyperlipidemia)   . HTN (hypertension)    Past Surgical History:  Procedure Laterality Date  . APPENDECTOMY    . CHOLECYSTECTOMY    . partial hysterectomy --- unknown     Family History  Problem Relation Age of Onset  . Heart attack Father        cause of death   Social History   Socioeconomic History  . Marital  status: Married    Spouse name: Remi Deter  . Number of children: 6  . Years of education: Not on file  . Highest education level: Not on file  Occupational History  . Occupation: RETIRED  Social Needs  . Financial resource strain: Not hard at all  . Food insecurity:    Worry: Never true    Inability: Never true  . Transportation needs:    Medical: No    Non-medical: No  Tobacco Use  . Smoking status: Never Smoker  . Smokeless tobacco: Never Used  Substance and Sexual Activity  . Alcohol use: No  . Drug use: No  . Sexual activity: Not Currently  Lifestyle  . Physical activity:    Days per week: 0 days    Minutes per session: 0 min  . Stress: Not at all  Relationships  . Social connections:    Talks on phone: More than three times a week    Gets together: More than three times a week    Attends religious service: More than 4 times per year    Active member of club or organization: Yes    Attends meetings of clubs or organizations: More than 4 times per year    Relationship status: Married  Other Topics Concern  . Not on file  Social History Narrative  . Not on file    Activities of Daily Living In your present state of health, do you have any difficulty performing the following activities: 11/26/2018  Difficulty concentrating or making decisions? N  Walking or climbing stairs? N  Dressing or bathing? N  Doing errands, shopping? N  Preparing Food and eating ? N  Using the Toilet? N  In the past six months, have you accidently leaked urine? N  Do you have problems with loss of bowel control? N  Managing your Medications? N  Managing your Finances? N  Some recent data might be hidden    Patient Literacy How often do you need to have someone help you when you read instructions, pamphlets, or other written materials from your doctor or pharmacy?: 1 - Never What is the last grade level you completed in school?: 9th Grade, GED   Exercise Current Exercise Habits: The  patient does not participate in regular exercise at present  Diet Patient reports consuming 3 meals a day and 0 snack(s) a day Patient reports that her primary diet is: Regular Patient reports that she does have regular access to food.   Depression Screen PHQ 2/9 Scores 11/26/2018 04/22/2018 01/28/2018 01/16/2018 01/07/2018 09/02/2017 07/19/2017  PHQ - 2 Score 0 0 0 0 0 0 0     Fall Risk Fall Risk  11/26/2018 04/22/2018 01/28/2018 01/16/2018 01/07/2018  Falls in the past year? 1 No No No No  Number falls in past yr: 0 - - - -  Injury with Fall? 1 - - - -  Risk for fall due to : History of fall(s) - - - -  Follow up Education provided - - - -     Objective:  Julie Riley seemed alert and oriented and she participated appropriately during our telephone visit.  Blood Pressure Weight BMI  BP Readings from Last 3 Encounters:  04/22/18 137/70  02/05/18 126/65  01/28/18 138/75   Wt Readings from Last 3 Encounters:  11/26/18 132 lb (59.9 kg)  04/22/18 132 lb 12.8 oz (60.2 kg)  02/05/18 133 lb (60.3 kg)   BMI Readings from Last 1 Encounters:  11/26/18 24.94 kg/m    *Unable to obtain current vital signs, weight, and BMI due to telephone visit type  Hearing/Vision  . Julie Riley did not seem to have difficulty with hearing/understanding during the telephone conversation . Reports that she has had a formal eye exam by an eye care professional within the past year . Reports that she has not had a formal hearing evaluation within the past year *Unable to fully assess hearing and vision during telephone visit type  Cognitive Function: 6CIT Screen 11/26/2018  What Year? 0 points  What month? 0 points  What time? 0 points  Count back from 20 0 points  Months in reverse 0 points  Repeat phrase 0 points  Total Score 0    Normal Cognitive Function Screening: Yes (Normal:0-7, Significant for Dysfunction: >8)  Immunization & Health Maintenance Record Immunization History  Administered Date(s)  Administered  . Influenza, High Dose Seasonal PF 04/15/2018    Health Maintenance  Topic Date Due  . FOOT EXAM  08/27/1941  . TETANUS/TDAP  08/26/1952  . DEXA SCAN  08/27/1998  . PNA vac Low Risk Adult (1 of 2 - PCV13) 08/27/1998  . HEMOGLOBIN A1C  07/19/2018  . INFLUENZA VACCINE  01/24/2019  . OPHTHALMOLOGY EXAM  09/03/2019       Assessment  This is a routine wellness examination for Julie Riley.  Health Maintenance: Due or Overdue Health Maintenance Due  Topic Date Due  . FOOT EXAM  [birth year]27/1941  . TETANUS/TDAP  08/26/1952  . DEXA SCAN  08/27/1998  . PNA vac Low Risk Adult (1 of 2 - PCV13) 08/27/1998  . HEMOGLOBIN A1C  07/19/2018    Julie Maxwell Sou does not need a referral for [employer] Assistance: Care Management:   no Social Work:    no Prescription Assistance:  no Nutrition/Diabetes Education:  no   Plan:  Personalized Goals Goals Addressed            This Visit's Progress   .  Acknowledge receipt of Advanced Directive package        Personalized Health Maintenance & Screening Recommendations  Pneumococcal vaccine  Td vaccine Diabetes screening Advanced directives: has NO advanced directive  - add't info requested. Referral to SW: no  Lung Cancer Screening Recommended: no (Low Dose CT Chest recommended if Age 62-80 years, 30 pack-year currently smoking OR have quit w/in past 15 years) Hepatitis C Screening recommended: no HIV Screening recommended: no  Advanced Directives: Written information was prepared per patient's request.  Referrals & Orders No orders of the defined types were placed in this encounter.   Follow-up Plan . Follow-up with Remus Loffler, PA-C as planned   I have personally reviewed and noted the following in the patient's chart:   . Medical and social history . Use of alcohol, tobacco or illicit drugs  . Current medications  and supplements . Functional ability and status . Nutritional status . Physical activity .  Advanced directives . List of other physicians . Hospitalizations, surgeries, and ER visits in previous 12 months . Vitals . Screenings to include cognitive, depression, and falls . Referrals and appointments  In addition, I have reviewed and discussed with Julie Riley certain preventive protocols, quality metrics, and best practice recommendations. A written personalized care plan for preventive services as well as general preventive health recommendations is available and can be mailed to the patient at her request.      Julie Riley  11/26/2018

## 2018-11-26 NOTE — Patient Instructions (Signed)
[name] , Thank you for taking time to come for your Medicare Wellness Visit. I appreciate your ongoing commitment to your health goals. Please review the following plan we discussed and let me know if I can assist you in the future.   These are the goals we discussed: Goals    .  Acknowledge receipt of Advanced Directive package       This is a list of the screening recommended for you and due dates:  Health Maintenance  Topic Date Due  . Complete foot exam   08/27/1943  . Tetanus Vaccine  08/26/1952  . DEXA scan (bone density measurement)  08/27/1998  . Pneumonia vaccines (1 of 2 - PCV13) 08/27/1998  . Hemoglobin A1C  07/19/2018  . Flu Shot  01/24/2019  . Eye exam for diabetics  09/03/2019     Understanding Your Risk for Falls Each year, millions of people suffer serious injuries from falls. It is important to understand your risk for falling. Talk with your health care provider about your risk and what you can do to lower it. There are actions you can take at home to lower your risk. If you do have a serious fall, it is important to tell your health care provider. Falling once raises your risk for falling again. How can falls affect me? Serious injuries from falls are common. These include:  Broken bones. Most hip fractures are caused by falls.  Traumatic brain injury (TBI). Falls are the most common cause of TBI. Fear of falling can also cause you to avoid activities and stay at home. This can make your muscles weaker and actually raise your risk for a fall. What can increase my risk? Serious injuries from a fall most often happen to people older than age 19. Children and young adults ages 36-29 are also at higher risk. The more risk factors you have for falling, the higher your risk. Risk factors include:  Weakness in the lower body.  Lack (deficiency) of vitamin D.  Weak bones (osteoporosis).  Being generally weak or confused due to long-term (chronic) illness.   Dizziness or balance problems.  Poor vision.  Having depression.  Medicine that causes dizziness or drowsiness. These can include medicines for your blood pressure, heart, anxiety, insomnia, or edema, as well as pain medicines and muscle relaxants.  Drinking alcohol.  Foot pain or improper footwear.  Working at a dangerous job.  Having had a fall in the past.  Tripping hazards at home, such as floor clutter or loose rugs, or poor lighting.  Having pets or clutter in your home. What actions can I take to lower my risk of falling?      Maintain physical fitness: ? Do strength and balance exercises. Consider taking a regular class to build strength and balance. Yoga and tai chi are good options. ? Have your eyes checked every year and your vision prescription updated as needed.  Remove all clutter from walkways and stairways, including extension cords.  Use a cordless phone.  Do not use throw rugs. Make sure all carpeting is taped or tacked down securely.  Use good lighting in all rooms. Keep a flashlight near your bed.  Make sure there is a clear path from your bed to the bathroom. Use night-lights.  Install grab bars for your tub, shower, and toilet. Use a bath mat in your tub or shower.  Attach secure railings on both sides of your stairs.  Repair uneven or broken steps.  Use a  cane or walker as directed by your health care provider.  Wear nonskid shoes. Do not wear high heels. Do not walk around the house in socks or slippers.  Avoid walking on icy or slippery surfaces. Walk on the grass instead of on icy or slick sidewalks. Where you can, use ice melt to get rid of ice on walkways. Questions to ask your health care provider  Can you help me evaluate my risk for a fall?  Do any of my medicines make me more likely to fall?  Should I take a vitamin D supplement?  What exercises can I do to improve my strength and balance?  Should I make an appointment to  have my vision checked?  Do I need a bone density test to check for osteoporosis?  Would it help to use a cane or a walker? Where to find more information  Centers for Disease Control and Prevention, STEADI: StoreMirror.com.cy  Community-Based Fall Prevention Programs: StoreMirror.com.cy  [employer] on Aging: ToneConnect.com.ee Contact a health care provider if:  You fall at home.  You are afraid of falling at home.  You feel weak, drowsy, or dizzy at home. Summary  People 40 and older are at high risk for falling. However, older people are not the only ones injured in falls. Children and young adults have a higher-than-normal risk, too.  Talk with your health care provider about your risks for falling and how to lower those risks.  Taking certain precautions at home can lower your risk for falling.  If you fall, always tell your health care provider. This information is not intended to replace advice given to you by your health care provider. Make sure you discuss any questions you have with your health care provider. Document Released: 01/23/2017 Document Revised: 01/23/2017 Document Reviewed: 01/23/2017 Elsevier Interactive Patient Education  2019 [place] Directive  Advance directives are legal documents that let you make choices ahead of time about your health care and medical treatment in case you become unable to communicate for yourself. Advance directives are a way for you to communicate your wishes to family, friends, and health care providers. This can help convey your decisions about end-of-life care if you become unable to communicate. Discussing and writing advance directives should happen over time rather than all at once. Advance directives can be changed depending on your situation and what you want, even after you have signed the advance directives. If you do not have an advance directive, some states assign family decision makers to act on your behalf  based on how closely you are related to them. Each state has its own laws regarding advance directives. You may want to check with your health care provider, attorney, or state representative about the laws in your state. There are different types of advance directives, such as:  Medical power of attorney.  Living will.  Do not resuscitate (DNR) or do not attempt resuscitation (DNAR) order. Health care proxy and medical power of attorney A health care proxy, also called a health care agent, is a person who is appointed to make medical decisions for you in cases in which you are unable to make the decisions yourself. Generally, people choose someone they know well and trust to represent their preferences. Make sure to ask this person for an agreement to act as your proxy. A proxy may have to exercise judgment in the event of a medical decision for which your wishes are not known. A  medical power of attorney is a legal document that names your health care proxy. Depending on the laws in your state, after the document is written, it may also need to be:  Signed.  Notarized.  Dated.  Copied.  Witnessed.  Incorporated into your medical record. You may also want to appoint someone to manage your financial affairs in a situation in which you are unable to do so. This is called a durable power of attorney for finances. It is a separate legal document from the durable power of attorney for health care. You may choose the same person or someone different from your health care proxy to act as your agent in financial matters. If you do not appoint a proxy, or if there is a concern that the proxy is not acting in your best interests, a court-appointed guardian may be designated to act on your behalf. Living will A living will is a set of instructions documenting your wishes about medical care when you cannot express them yourself. Health care providers should keep a copy of your living will in your  medical record. You may want to give a copy to family members or friends. To alert caregivers in case of an emergency, you can place a card in your wallet to let them know that you have a living will and where they can find it. A living will is used if you become:  Terminally ill.  Incapacitated.  Unable to communicate or make decisions. Items to consider in your living will include:  The use or non-use of life-sustaining equipment, such as dialysis machines and breathing machines (ventilators).  A DNR or DNAR order, which is the instruction not to use cardiopulmonary resuscitation (CPR) if breathing or heartbeat stops.  The use or non-use of tube feeding.  Withholding of food and fluids.  Comfort (palliative) care when the goal becomes comfort rather than a cure.  Organ and tissue donation. A living will does not give instructions for distributing your money and property if you should pass away. It is recommended that you seek the advice of a lawyer when writing a will. Decisions about taxes, beneficiaries, and asset distribution will be legally binding. This process can relieve your family and friends of any concerns surrounding disputes or questions that may come up about the distribution of your assets. DNR or DNAR A DNR or DNAR order is a request not to have CPR in the event that your heart stops beating or you stop breathing. If a DNR or DNAR order has not been made and shared, a health care provider will try to help any patient whose heart has stopped or who has stopped breathing. If you plan to have surgery, talk with your health care provider about how your DNR or DNAR order will be followed if problems occur. Summary  Advance directives are the legal documents that allow you to make choices ahead of time about your health care and medical treatment in case you become unable to communicate for yourself.  The process of discussing and writing advance directives should happen over  time. You can change the advance directives, even after you have signed them.  Advance directives include DNR or DNAR orders, living wills, and designating an agent as your medical power of attorney. This information is not intended to replace advice given to you by your health care provider. Make sure you discuss any questions you have with your health care provider. Document Released: 09/18/2007 Document Revised: 04/30/2016 Document Reviewed: 04/30/2016  Chartered certified accountant Patient Education  [employer].

## 2019-05-14 ENCOUNTER — Other Ambulatory Visit: Payer: Self-pay

## 2019-05-15 ENCOUNTER — Ambulatory Visit (INDEPENDENT_AMBULATORY_CARE_PROVIDER_SITE_OTHER): Payer: Medicare Other | Admitting: Physician Assistant

## 2019-05-15 DIAGNOSIS — I10 Essential (primary) hypertension: Secondary | ICD-10-CM

## 2019-05-15 DIAGNOSIS — N3281 Overactive bladder: Secondary | ICD-10-CM

## 2019-05-15 DIAGNOSIS — E119 Type 2 diabetes mellitus without complications: Secondary | ICD-10-CM

## 2019-05-15 DIAGNOSIS — E7800 Pure hypercholesterolemia, unspecified: Secondary | ICD-10-CM | POA: Diagnosis not present

## 2019-05-20 ENCOUNTER — Encounter: Payer: Self-pay | Admitting: Physician Assistant

## 2019-05-20 NOTE — Progress Notes (Signed)
    Telephone visit  Subjective: FG:[id number] chronic conditions PCP: Terald Sleeper, PA-C JIR:CVEL S Julie Riley is a 85 y.o. female calls for telephone consult today. Patient provides verbal consent for consult held via phone.  Patient is identified with 2 separate identifiers.  At this time the entire area is on COVID-19 social distancing and stay home orders are in place.  Patient is of higher risk and therefore we are performing this by a virtual method.  Location of patient: home Location of provider: HOME Others present for call: no  Patient is have a 41-month follow-up on her chronic medical conditions which do include hypertension, type 2 diabetes controlled, overactive bladder, hyperlipidemia.  There is a lab order in place for her to come in at her earliest convenience.  She states that she will try to do this.  All of her medications are up-to-date and have refills.  So we did not needs anything at this time.  She states that overall she and her husband are doing very well.   ROS: Per HPI  Allergies  Allergen Reactions  . Amoxicillin   . Penicillins   . Xarelto [Rivaroxaban] Other (See Comments)    Severe diarrhea   Past Medical History:  Diagnosis Date  . CAD (coronary artery disease)    nonobsturctive.   . Carotid stenosis    Less than 50% bilateral ICA stenosis, 2008.  . Diabetes mellitus ([place])   . HLD (hyperlipidemia)   . HTN (hypertension)     Current Outpatient Medications:  .  aspirin 325 MG EC tablet, Take 325 mg by mouth daily., Disp: , Rfl:  .  atorvastatin (LIPITOR) 10 MG tablet, Take 1 tablet (10 mg total) by mouth daily., Disp: 90 tablet, Rfl: 3 .  Cholecalciferol (VITAMIN D-3) 5000 units TABS, Take 1 tablet by mouth daily., Disp: 30 tablet, Rfl:  .  diltiazem (CARDIZEM) 30 MG tablet, Take 1 tablet (30 mg total) by mouth 2 (two) times daily., Disp: 180 tablet, Rfl: 3 .  enalapril (VASOTEC) 20 MG tablet, Take 1 tablet (20 mg total) by mouth 2 (two) times  daily., Disp: 180 tablet, Rfl: 3 .  gabapentin (NEURONTIN) 300 MG capsule, Take 1 capsule (300 mg total) by mouth daily., Disp: 90 capsule, Rfl: 3 .  glucose blood test strip, Use to check blood sugars two times daily, Disp: 100 each, Rfl: 11 .  ketoconazole (NIZORAL) 2 % cream, Apply 1 application topically daily., Disp: 15 g, Rfl: 0 .  metFORMIN (GLUCOPHAGE) 500 MG tablet, Take 1 tablet (500 mg total) by mouth 2 (two) times daily with a meal., Disp: 180 tablet, Rfl: 3 .  nystatin (MYCOSTATIN/NYSTOP) powder, Apply topically 4 (four) times daily., Disp: 60 g, Rfl: 2 .  RELION LANCETS MICRO-THIN 33G MISC, Use to check blood sugars two times daily, Disp: 100 each, Rfl: 2  Assessment/ Plan: 85 y.o. female   1. Essential hypertension Enalapril 10 mg 1 twice daily Diltiazem 30 mg twice daily  2. Type 2 diabetes mellitus without complication, without long-term current use of insulin (HCC) Metformin 500 mg twice daily  3. OAB (overactive bladder) Wants to discontinue indication for this  4. Pure hypercholesterolemia Atorvastatin 10 mg daily   No follow-ups on file.  Continue all other maintenance medications as listed above.  Start time: 8:10 AM End time: 8:22 AM  No orders of the defined types were placed in this encounter.   Particia Nearing PA-C [place] [phone]

## 2019-05-26 ENCOUNTER — Other Ambulatory Visit: Payer: Medicare Other

## 2019-05-26 ENCOUNTER — Other Ambulatory Visit: Payer: Self-pay

## 2019-05-26 DIAGNOSIS — E119 Type 2 diabetes mellitus without complications: Secondary | ICD-10-CM

## 2019-05-26 DIAGNOSIS — I10 Essential (primary) hypertension: Secondary | ICD-10-CM | POA: Diagnosis not present

## 2019-05-26 DIAGNOSIS — E7800 Pure hypercholesterolemia, unspecified: Secondary | ICD-10-CM | POA: Diagnosis not present

## 2019-05-26 LAB — BAYER DCA HB A1C WAIVED: HB A1C (BAYER DCA - WAIVED): 7.4 % — ABNORMAL HIGH (ref <7.0)

## 2019-05-27 LAB — CBC WITH DIFFERENTIAL/PLATELET
Basophils Absolute: 0.1 10*3/uL (ref 0.0–0.2)
Basos: 1 %
EOS (ABSOLUTE): 0.2 10*3/uL (ref 0.0–0.4)
Eos: 3 %
Hematocrit: 37.8 % (ref 34.0–46.6)
Hemoglobin: 13.1 g/dL (ref 11.1–15.9)
Immature Grans (Abs): 0 10*3/uL (ref 0.0–0.1)
Immature Granulocytes: 0 %
Lymphocytes Absolute: 1.9 10*3/uL (ref 0.7–3.1)
Lymphs: 26 %
MCH: 31.4 pg (ref 26.6–33.0)
MCHC: 34.7 g/dL (ref 31.5–35.7)
MCV: 91 fL (ref 79–97)
Monocytes Absolute: 0.7 10*3/uL (ref 0.1–0.9)
Monocytes: 10 %
Neutrophils Absolute: 4.4 10*3/uL (ref 1.4–7.0)
Neutrophils: 60 %
Platelets: 213 10*3/uL (ref 150–450)
RBC: 4.17 x10E6/uL (ref 3.77–5.28)
RDW: 12.5 % (ref 11.7–15.4)
WBC: 7.3 10*3/uL (ref 3.4–10.8)

## 2019-05-27 LAB — CMP14+EGFR
ALT: 11 IU/L (ref 0–32)
AST: 16 IU/L (ref 0–40)
Albumin/Globulin Ratio: 1.8 (ref 1.2–2.2)
Albumin: 4.4 g/dL (ref 3.6–4.6)
Alkaline Phosphatase: 92 IU/L (ref 39–117)
BUN/Creatinine Ratio: 24 (ref 12–28)
BUN: 17 mg/dL (ref 8–27)
Bilirubin Total: 0.6 mg/dL (ref 0.0–1.2)
CO2: 25 mmol/L (ref 20–29)
Calcium: 9.5 mg/dL (ref 8.7–10.3)
Chloride: 104 mmol/L (ref 96–106)
Creatinine, Ser: 0.72 mg/dL (ref 0.57–1.00)
GFR calc Af Amer: 88 mL/min/{1.73_m2} (ref >59)
GFR calc non Af Amer: 77 mL/min/{1.73_m2} (ref >59)
Globulin, Total: 2.5 g/dL (ref 1.5–4.5)
Glucose: 146 mg/dL — ABNORMAL HIGH (ref 65–99)
Potassium: 3.6 mmol/L (ref 3.5–5.2)
Sodium: 143 mmol/L (ref 134–144)
Total Protein: 6.9 g/dL (ref 6.0–8.5)

## 2019-05-27 LAB — MICROALBUMIN / CREATININE URINE RATIO
Creatinine, Urine: 14.7 mg/dL
Microalb/Creat Ratio: 456 mg/g creat — ABNORMAL HIGH (ref 0–29)
Microalbumin, Urine: 67 ug/mL

## 2019-05-27 LAB — LIPID PANEL
Chol/HDL Ratio: 2.7 ratio (ref 0.0–4.4)
Cholesterol, Total: 158 mg/dL (ref 100–199)
HDL: 59 mg/dL (ref >39)
LDL Chol Calc (NIH): 83 mg/dL (ref 0–99)
Triglycerides: 85 mg/dL (ref 0–149)
VLDL Cholesterol Cal: 16 mg/dL (ref 5–40)

## 2019-05-29 ENCOUNTER — Other Ambulatory Visit: Payer: Self-pay | Admitting: *Deleted

## 2019-05-29 DIAGNOSIS — E119 Type 2 diabetes mellitus without complications: Secondary | ICD-10-CM

## 2019-05-29 MED ORDER — METFORMIN HCL 500 MG PO TABS: 1000.0000 mg | ORAL_TABLET | Freq: Two times a day (BID) | ORAL | 3 refills | Status: DC

## 2019-05-29 NOTE — Progress Notes (Signed)
rx for increased metformin sent to pharmacy per AJ notes on results.

## 2019-06-29 ENCOUNTER — Telehealth: Payer: Self-pay | Admitting: Physician Assistant

## 2019-06-29 NOTE — Telephone Encounter (Signed)
There is nothing on her current list that needs adjustment at this time.

## 2019-06-29 NOTE — Telephone Encounter (Signed)
Patient tested positive for COVID. Patients daughter was told to let patients PCP know in case her medications needed to be changed. Call [phone]

## 2019-06-30 ENCOUNTER — Telehealth: Payer: Self-pay

## 2019-06-30 ENCOUNTER — Other Ambulatory Visit: Payer: Self-pay | Admitting: Physician Assistant

## 2019-06-30 MED ORDER — ALPRAZOLAM 0.25 MG PO TABS: 0.1250 mg | ORAL_TABLET | Freq: Two times a day (BID) | ORAL | 0 refills | Status: DC | PRN

## 2019-06-30 NOTE — Telephone Encounter (Signed)
Patient is very anxious and upset about not being able to see husband in hospital due to both of them being COVID positive. Daughter is wanting to know if there is anything that you can give her for her nerves?  She is insisting on going to see her husband at the hospital and is very confused. Please advise

## 2019-06-30 NOTE — Telephone Encounter (Signed)
Patient's daughter notified.

## 2019-07-13 ENCOUNTER — Other Ambulatory Visit: Payer: Self-pay | Admitting: Physician Assistant

## 2019-07-13 DIAGNOSIS — I10 Essential (primary) hypertension: Secondary | ICD-10-CM

## 2019-07-13 MED ORDER — ENALAPRIL MALEATE 20 MG PO TABS: 20.0000 mg | ORAL_TABLET | Freq: Two times a day (BID) | ORAL | 3 refills | Status: DC

## 2019-07-14 ENCOUNTER — Other Ambulatory Visit: Payer: Self-pay | Admitting: *Deleted

## 2019-07-14 DIAGNOSIS — E119 Type 2 diabetes mellitus without complications: Secondary | ICD-10-CM

## 2019-07-14 DIAGNOSIS — I10 Essential (primary) hypertension: Secondary | ICD-10-CM

## 2019-07-14 DIAGNOSIS — E7800 Pure hypercholesterolemia, unspecified: Secondary | ICD-10-CM

## 2019-07-14 DIAGNOSIS — G629 Polyneuropathy, unspecified: Secondary | ICD-10-CM

## 2019-07-14 DIAGNOSIS — Z8679 Personal history of other diseases of the circulatory system: Secondary | ICD-10-CM

## 2019-07-14 MED ORDER — GABAPENTIN 300 MG PO CAPS: 300.0000 mg | ORAL_CAPSULE | Freq: Every day | ORAL | 3 refills | Status: DC

## 2019-07-14 MED ORDER — RELION LANCETS MICRO-THIN 33G MISC: 2 refills | Status: DC

## 2019-07-14 MED ORDER — GLUCOSE BLOOD VI STRP: ORAL_STRIP | 11 refills | Status: DC

## 2019-07-14 MED ORDER — DILTIAZEM HCL 30 MG PO TABS: 30.0000 mg | ORAL_TABLET | Freq: Two times a day (BID) | ORAL | 3 refills | Status: DC

## 2019-07-14 MED ORDER — METFORMIN HCL 500 MG PO TABS: 1000.0000 mg | ORAL_TABLET | Freq: Two times a day (BID) | ORAL | 3 refills | Status: DC

## 2019-07-14 MED ORDER — ATORVASTATIN CALCIUM 10 MG PO TABS: 10.0000 mg | ORAL_TABLET | Freq: Every day | ORAL | 3 refills | Status: DC

## 2019-07-14 MED ORDER — ENALAPRIL MALEATE 20 MG PO TABS: 20.0000 mg | ORAL_TABLET | Freq: Two times a day (BID) | ORAL | 3 refills | Status: DC

## 2019-07-14 NOTE — Telephone Encounter (Signed)
Patient needed rx transferred to [facility] drug they are going to package medication for her. Rx sent.

## 2019-07-20 ENCOUNTER — Ambulatory Visit (INDEPENDENT_AMBULATORY_CARE_PROVIDER_SITE_OTHER): Payer: Medicare Other | Admitting: Physician Assistant

## 2019-07-20 ENCOUNTER — Encounter: Payer: Self-pay | Admitting: Physician Assistant

## 2019-07-20 ENCOUNTER — Other Ambulatory Visit: Payer: Self-pay

## 2019-07-20 VITALS — BP 135/70 | HR 81 | Temp 98.2°F | Ht 61.0 in | Wt 123.2 lb

## 2019-07-20 DIAGNOSIS — R410 Disorientation, unspecified: Secondary | ICD-10-CM | POA: Diagnosis not present

## 2019-07-20 DIAGNOSIS — I10 Essential (primary) hypertension: Secondary | ICD-10-CM | POA: Diagnosis not present

## 2019-07-20 DIAGNOSIS — R413 Other amnesia: Secondary | ICD-10-CM | POA: Diagnosis not present

## 2019-07-20 MED ORDER — DONEPEZIL HCL 5 MG PO TABS: 5.0000 mg | ORAL_TABLET | Freq: Every day | ORAL | 5 refills | Status: DC

## 2019-07-20 MED ORDER — VITAMIN D3 50 MCG (2000 UT) PO CAPS: 2000.0000 [IU] | ORAL_CAPSULE | Freq: Every day | ORAL | 3 refills | Status: DC

## 2019-07-20 NOTE — Progress Notes (Signed)
Subjective:    Patient ID: Julie Riley, female    DOB: 10-22-1931, 86 y.o.   MRN: [id number]  Chief Complaint  Patient presents with  . Hypertension  . Memory Loss    Hypertension This is a chronic problem. The problem is controlled. Pertinent negatives include no anxiety. There are no associated agents to hypertension. Past treatments include calcium channel blockers. The current treatment provides significant improvement.   Confusion Is present with her daughter today.  She lost her husband just a few weeks ago due to Covid.  They have been married for many years.  Over the years it was noted that he had poor vision but she would drive and they did well together.  However she had been getting more confused and more lost in the recent months.  She denies having any hallucinations.  She is sleeping well.  She does have decreased appetite.  There is some remote memory loss/dementia in her family.  There is no one close to her.  Recent Covid infection She has completely healed, and only had minimal symptoms.   MMSE - Mini Mental State Exam 07/20/2019  Orientation to time 4  Orientation to Place 5  Registration 3  Attention/ Calculation 2  Recall 0  Language- name 2 objects 2  Language- repeat 1  Language- follow 3 step command 3  Language- read & follow direction 1  Write a sentence 1  Copy design 0  Total score 22     Past Medical History:  Diagnosis Date  . CAD (coronary artery disease)    nonobsturctive.   . Carotid stenosis    Less than 50% bilateral ICA stenosis, 2008.  . Diabetes mellitus (HCC)   . HLD (hyperlipidemia)   . HTN (hypertension)     Past Surgical History:  Procedure Laterality Date  . APPENDECTOMY    . CHOLECYSTECTOMY    . partial hysterectomy --- unknown      Family History  Problem Relation Age of Onset  . Heart attack Father        cause of death    Social History   Socioeconomic History  . Marital status: Married    Spouse  name: Remi Deter  . Number of children: 6  . Years of education: Not on file  . Highest education level: Not on file  Occupational History  . Occupation: RETIRED  Tobacco Use  . Smoking status: Never Smoker  . Smokeless tobacco: Never Used  Substance and Sexual Activity  . Alcohol use: No  . Drug use: No  . Sexual activity: Not Currently  Other Topics Concern  . Not on file  Social History Narrative  . Not on file   Social Determinants of Health   Financial Resource Strain: Low Risk   . Difficulty of Paying Living Expenses: Not hard at all  Food Insecurity: No Food Insecurity  . Worried About Programme researcher, broadcasting/film/video in the Last Year: Never true  . Ran Out of Food in the Last Year: Never true  Transportation Needs: No Transportation Needs  . Lack of Transportation (Medical): No  . Lack of Transportation (Non-Medical): No  Physical Activity: Inactive  . Days of Exercise per Week: 0 days  . Minutes of Exercise per Session: 0 min  Stress: No Stress Concern Present  . Feeling of Stress : Not at all  Social Connections: Not Isolated  . Frequency of Communication with Friends and Family: More than three times a week  .  Frequency of Social Gatherings with Friends and Family: More than three times a week  . Attends Religious Services: More than 4 times per year  . Active Member of Clubs or Organizations: Yes  . Attends Archivist Meetings: More than 4 times per year  . Marital Status: Married  Human resources officer Violence: Not At Risk  . Fear of Current or Ex-Partner: No  . Emotionally Abused: No  . Physically Abused: No  . Sexually Abused: No    Outpatient Medications Prior to Visit  Medication Sig Dispense Refill  . ALPRAZolam (XANAX) 0.25 MG tablet Take 0.5-1 tablets (0.125-0.25 mg total) by mouth 2 (two) times daily as needed for anxiety. 20 tablet 0  . aspirin 325 MG EC tablet Take 325 mg by mouth daily.    Marland Kitchen atorvastatin (LIPITOR) 10 MG tablet Take 1 tablet (10 mg  total) by mouth daily. 90 tablet 3  . diltiazem (CARDIZEM) 30 MG tablet Take 1 tablet (30 mg total) by mouth 2 (two) times daily. 180 tablet 3  . enalapril (VASOTEC) 20 MG tablet Take 1 tablet (20 mg total) by mouth 2 (two) times daily. 180 tablet 3  . gabapentin (NEURONTIN) 300 MG capsule Take 1 capsule (300 mg total) by mouth daily. 90 capsule 3  . glucose blood test strip Use to check blood sugars two times daily 100 each 11  . ketoconazole (NIZORAL) 2 % cream Apply 1 application topically daily. 15 g 0  . metFORMIN (GLUCOPHAGE) 500 MG tablet Take 2 tablets (1,000 mg total) by mouth 2 (two) times daily with a meal. 360 tablet 3  . nystatin (MYCOSTATIN/NYSTOP) powder Apply topically 4 (four) times daily. 60 g 2  . ReliOn Lancets Micro-Thin 33G MISC Use to check blood sugars two times daily 100 each 2  . Cholecalciferol (VITAMIN D-3) 5000 units TABS Take 1 tablet by mouth daily. 30 tablet    No facility-administered medications prior to visit.    Allergies  Allergen Reactions  . Amoxicillin   . Penicillins   . Xarelto [Rivaroxaban] Other (See Comments)    Severe diarrhea    Review of Systems  Constitutional: Negative.   HENT: Negative.   Eyes: Negative.   Respiratory: Negative.   Gastrointestinal: Negative.   Genitourinary: Negative.   Neurological: Negative for dizziness, seizures, weakness and numbness.  Psychiatric/Behavioral: Positive for confusion and decreased concentration. Negative for sleep disturbance and suicidal ideas.       Objective:    Physical Exam Constitutional:      General: She is not in acute distress.    Appearance: Normal appearance. She is well-developed.  HENT:     Head: Normocephalic and atraumatic.  Cardiovascular:     Rate and Rhythm: Normal rate.  Pulmonary:     Effort: Pulmonary effort is normal.  Skin:    General: Skin is warm and dry.     Findings: No rash.  Neurological:     Mental Status: She is alert and oriented to person, place,  and time.     Deep Tendon Reflexes: Reflexes are normal and symmetric.     BP 135/70   Pulse 81   Temp 98.2 F (36.8 C) (Temporal)   Ht 5\' 1"  (1.549 m)   Wt 123 lb 3.2 oz (55.9 kg)   SpO2 97%   BMI 23.28 kg/m  Wt Readings from Last 3 Encounters:  07/20/19 123 lb 3.2 oz (55.9 kg)  11/26/18 132 lb (59.9 kg)  04/22/18 132 lb 12.8 oz (60.2  kg)    Health Maintenance Due  Topic Date Due  . FOOT EXAM  08/27/1943  . TETANUS/TDAP  08/26/1952  . DEXA SCAN  08/27/1998  . PNA vac Low Risk Adult (1 of 2 - PCV13) 08/27/1998  . INFLUENZA VACCINE  01/24/2019    There are no preventive care reminders to display for this patient.   No results found for: TSH Lab Results  Component Value Date   WBC 7.3 05/26/2019   HGB 13.1 05/26/2019   HCT 37.8 05/26/2019   MCV 91 05/26/2019   PLT 213 05/26/2019   Lab Results  Component Value Date   NA 143 05/26/2019   K 3.6 05/26/2019   CO2 25 05/26/2019   GLUCOSE 146 (H) 05/26/2019   BUN 17 05/26/2019   CREATININE 0.72 05/26/2019   BILITOT 0.6 05/26/2019   ALKPHOS 92 05/26/2019   AST 16 05/26/2019   ALT 11 05/26/2019   PROT 6.9 05/26/2019   ALBUMIN 4.4 05/26/2019   CALCIUM 9.5 05/26/2019   Lab Results  Component Value Date   CHOL 158 05/26/2019   Lab Results  Component Value Date   HDL 59 05/26/2019   Lab Results  Component Value Date   LDLCALC 83 05/26/2019   Lab Results  Component Value Date   TRIG 85 05/26/2019   Lab Results  Component Value Date   CHOLHDL 2.7 05/26/2019   Lab Results  Component Value Date   HGBA1C 7.4 (H) 05/26/2019       Assessment & Plan:   Problem List Items Addressed This Visit      Cardiovascular and Mediastinum   Essential hypertension    Other Visit Diagnoses    Memory loss    -  Primary   Relevant Medications   Cholecalciferol (VITAMIN D3) 50 MCG (2000 UT) capsule   donepezil (ARICEPT) 5 MG tablet   Confusion       Relevant Medications   donepezil (ARICEPT) 5 MG tablet        Meds ordered this encounter  Medications  . DISCONTD: donepezil (ARICEPT) 5 MG tablet    Sig: Take 1 tablet (5 mg total) by mouth at bedtime.    Dispense:  30 tablet    Refill:  5    Order Specific Question:   Supervising Provider    Answer:   Raliegh Ip [[id number]]  . Cholecalciferol (VITAMIN D3) 50 MCG (2000 UT) capsule    Sig: Take 1 capsule (2,000 Units total) by mouth daily.    Dispense:  90 capsule    Refill:  3    Order Specific Question:   Supervising Provider    Answer:   Raliegh Ip [[id number]]  . donepezil (ARICEPT) 5 MG tablet    Sig: Take 1 tablet (5 mg total) by mouth at bedtime.    Dispense:  30 tablet    Refill:  5    Order Specific Question:   Supervising Provider    Answer:   Raliegh Ip [[id number]]     Remus Loffler, PA-C

## 2019-07-31 ENCOUNTER — Telehealth: Payer: Self-pay | Admitting: Physician Assistant

## 2019-08-02 NOTE — Telephone Encounter (Signed)
I think she would be responsible either way, if she continues to have her license or if she gives them up.  I can write a letter stating that it is medically recommended that she give them up, if this will help her mentally to do it.  The other part it that the family will just have to take her keys and the car if you feel she will forget and continue to drive out of habit and necessity.  If there is no car, she cannot do any harm by driving.  I understand it is a very hard decision to make. This is where the family as a whole needs to meet with her and make the best plan with all parties involved. I know she will to want to leave her home but it may be necessary to allow the family to watch her and help her on n easier basis.  We have a Child psychotherapist that I can reach out to, he may have counseled families over this hurdle more than me.  I have personally experienced it with my dad.  I had to be the one he was angry with for many months. But he eventually forgot, and he was never in a crash or hurt anyone else.  She has good physical health still and may go a long time.

## 2019-08-03 NOTE — Telephone Encounter (Signed)
Can you look over this situation and is it something you can do? I can place the order. Dealing with dementia.

## 2019-08-03 NOTE — Telephone Encounter (Signed)
Patient daughter would like to go ahead with Child psychotherapist , but wants the social worker to call daughters first

## 2019-08-03 NOTE — Telephone Encounter (Signed)
Mailbox full

## 2019-08-04 ENCOUNTER — Other Ambulatory Visit: Payer: Self-pay | Admitting: Physician Assistant

## 2019-08-04 DIAGNOSIS — F0390 Unspecified dementia without behavioral disturbance: Secondary | ICD-10-CM

## 2019-08-04 DIAGNOSIS — F4321 Adjustment disorder with depressed mood: Secondary | ICD-10-CM

## 2019-08-04 NOTE — Progress Notes (Signed)
ccm 

## 2019-08-04 NOTE — Telephone Encounter (Signed)
I have placed an order for chronic care management.  Baxter Hire daily is the nurse and Lorna Few is the Child psychotherapist.  I put in the order for them to reach out to the daughters first and the number should be in epic in her chart.  Let them know that they should expect contact from them.

## 2019-08-04 NOTE — Telephone Encounter (Signed)
I have Scott looking over this, I have asked him if she qualifies to be under chronic care management.  And that way the family can talk to him frequently.  We are still in a pending state

## 2019-08-05 NOTE — Telephone Encounter (Signed)
Lmtcb.

## 2019-08-06 NOTE — Telephone Encounter (Signed)
Family has taken keys to car so patient can no longer drive.

## 2019-08-10 ENCOUNTER — Ambulatory Visit: Payer: Medicare Other | Admitting: Physician Assistant

## 2019-08-13 ENCOUNTER — Ambulatory Visit: Payer: Self-pay | Admitting: Licensed Clinical Social Worker

## 2019-08-13 DIAGNOSIS — Z8679 Personal history of other diseases of the circulatory system: Secondary | ICD-10-CM

## 2019-08-13 DIAGNOSIS — E7800 Pure hypercholesterolemia, unspecified: Secondary | ICD-10-CM

## 2019-08-13 DIAGNOSIS — I10 Essential (primary) hypertension: Secondary | ICD-10-CM

## 2019-08-13 DIAGNOSIS — E119 Type 2 diabetes mellitus without complications: Secondary | ICD-10-CM

## 2019-08-13 NOTE — Patient Instructions (Addendum)
Licensed Clinical Actuary Provided: No  I reached out to [employer], daughter, and Florinda Taflinger , daughter, by phone today.   LCSW called phone numbers for Julie Riley, daughter of client , several times today and was not able to speak via phone today with Julie Riley. LCSW also called the phone number for Julie Riley, daughter of client, several times today, left phone message for Julie Riley, but was not able to speak with Julie Riley today. LCSW left Julie Hocking LCSW phone number and invited Julie Riley or Julie Riley to return call to LCSW to discuss CCM program services.  Follow Up Plan: LCSW to call Julie Riley or her daughters in next 4 weeks to talk with Julie Riley/her daughter about CCM program services   The patient verbalized understanding of instructions provided today and declined a print copy of patient instruction materials.   Julie Riley.Julie Riley MSW, LCSW Licensed Clinical Social Worker [facility] Care Management [phone]

## 2019-08-13 NOTE — Chronic Care Management (AMB) (Signed)
  Care Management Note   Julie Riley is a 86 y.o. year old female who is a primary care patient of Remus Loffler, PA-C. The CM team was consulted for assistance with chronic disease management and care coordination.   I reached out to Loney Loh, daughter, and Marcelyn Ruppe , daughter, by phone today.   Review of patient status, including review of consultants reports, relevant laboratory and other test results, and collaboration with appropriate care team members and the patient's provider was performed as part of comprehensive patient evaluation and provision of chronic care management services.   Medications   ALPRAZolam (XANAX) 0.25 MG tablet aspirin 325 MG EC tablet atorvastatin (LIPITOR) 10 MG tablet Cholecalciferol (VITAMIN D3) 50 MCG (2000 UT) capsule diltiazem (CARDIZEM) 30 MG tablet donepezil (ARICEPT) 5 MG tablet enalapril (VASOTEC) 20 MG tablet gabapentin (NEURONTIN) 300 MG capsule glucose blood test strip ketoconazole (NIZORAL) 2 % cream metFORMIN (GLUCOPHAGE) 500 MG tablet nystatin (MYCOSTATIN/NYSTOP) powder ReliOn Lancets Micro-Thin 33G MISC   LCSW called phone numbers for [name], daughter of client , several times today and was not able to speak via phone today with [name]. LCSW also called the phone number for [name], daughter of client, several times today, left phone message for [name], but was not able to speak with [name] today. LCSW left [name] LCSW phone number and invited [name] or [name] to return call to LCSW to discuss CCM program services.  Follow Up Plan: LCSW to call [name] or her daughters in next 4 weeks to talk with Sharonlee/her daughter about CCM program services  [name].Quiera Diffee MSW, LCSW Licensed Clinical Social Worker [facility] Care Management [phone]

## 2019-08-19 ENCOUNTER — Ambulatory Visit (INDEPENDENT_AMBULATORY_CARE_PROVIDER_SITE_OTHER): Payer: Medicare Other | Admitting: Physician Assistant

## 2019-08-19 ENCOUNTER — Encounter: Payer: Self-pay | Admitting: Physician Assistant

## 2019-08-19 DIAGNOSIS — R413 Other amnesia: Secondary | ICD-10-CM

## 2019-08-19 DIAGNOSIS — R410 Disorientation, unspecified: Secondary | ICD-10-CM | POA: Diagnosis not present

## 2019-08-19 NOTE — Progress Notes (Signed)
Telephone visit  Subjective: CC: Recheck dementia PCP: Remus Loffler, PA-C QIO:NGEX S Wiley is a 86 y.o. female calls for telephone consult today. Patient provides verbal consent for consult held via phone.  Patient is identified with 2 separate identifiers.  At this time the entire area is on COVID-19 social distancing and stay home orders are in place.  Patient is of higher risk and therefore we are performing this by a virtual method.  Location of patient: Home Location of provider: HOME Others present for call: Daughter Lanora    I have spoken with her daughter Sofi today.  It appears that Mrs. Ronna Polio only took the Aricept for maybe 10 days.  And then she chose not to take it any for.  Her dementia is fairly advanced but not so severe that she cannot be alone.  We are still working on getting her to not drive.  That is something that the family is going to have to work on.  I put them in touch with our Child psychotherapist.  Hopefully he can give them some help during this time of transition.  For now the decision has been made to just give her comfort measures and help treat symptoms as needed.  I encouraged him to be back in touch with [place] if there is problems with sleep or agitation.  There may be some medicine that can be used at that time.  So for now we will discontinue Aricept.   ROS: Per HPI  Allergies  Allergen Reactions  . Amoxicillin   . Penicillins   . Xarelto [Rivaroxaban] Other (See Comments)    Severe diarrhea   Past Medical History:  Diagnosis Date  . CAD (coronary artery disease)    nonobsturctive.   . Carotid stenosis    Less than 50% bilateral ICA stenosis, 2008.  . Diabetes mellitus (HCC)   . HLD (hyperlipidemia)   . HTN (hypertension)     Current Outpatient Medications:  .  ALPRAZolam (XANAX) 0.25 MG tablet, Take 0.5-1 tablets (0.125-0.25 mg total) by mouth 2 (two) times daily as needed for anxiety., Disp: 20 tablet, Rfl: 0 .  aspirin 325 MG EC  tablet, Take 325 mg by mouth daily., Disp: , Rfl:  .  atorvastatin (LIPITOR) 10 MG tablet, Take 1 tablet (10 mg total) by mouth daily., Disp: 90 tablet, Rfl: 3 .  Cholecalciferol (VITAMIN D3) 50 MCG (2000 UT) capsule, Take 1 capsule (2,000 Units total) by mouth daily., Disp: 90 capsule, Rfl: 3 .  diltiazem (CARDIZEM) 30 MG tablet, Take 1 tablet (30 mg total) by mouth 2 (two) times daily., Disp: 180 tablet, Rfl: 3 .  donepezil (ARICEPT) 5 MG tablet, Take 1 tablet (5 mg total) by mouth at bedtime., Disp: 30 tablet, Rfl: 5 .  enalapril (VASOTEC) 20 MG tablet, Take 1 tablet (20 mg total) by mouth 2 (two) times daily., Disp: 180 tablet, Rfl: 3 .  gabapentin (NEURONTIN) 300 MG capsule, Take 1 capsule (300 mg total) by mouth daily., Disp: 90 capsule, Rfl: 3 .  glucose blood test strip, Use to check blood sugars two times daily, Disp: 100 each, Rfl: 11 .  ketoconazole (NIZORAL) 2 % cream, Apply 1 application topically daily., Disp: 15 g, Rfl: 0 .  metFORMIN (GLUCOPHAGE) 500 MG tablet, Take 2 tablets (1,000 mg total) by mouth 2 (two) times daily with a meal., Disp: 360 tablet, Rfl: 3 .  nystatin (MYCOSTATIN/NYSTOP) powder, Apply topically 4 (four) times daily., Disp: 60 g,  Rfl: 2 .  ReliOn Lancets Micro-Thin 33G MISC, Use to check blood sugars two times daily, Disp: 100 each, Rfl: 2  Assessment/ Plan: 86 y.o. female   1. Memory loss Follow-up as needed with support  2. Confusion Follow as needed with support   No follow-ups on file.  Continue all other maintenance medications as listed above.  Start time: 3:15 PM End time: 3:27 PM  No orders of the defined types were placed in this encounter.   Particia Nearing PA-C [place] [phone]

## 2019-08-20 ENCOUNTER — Ambulatory Visit: Payer: Medicare Other | Admitting: Physician Assistant

## 2019-08-26 ENCOUNTER — Ambulatory Visit: Payer: Medicare Other | Admitting: *Deleted

## 2019-08-26 NOTE — Chronic Care Management (AMB) (Signed)
  Chronic Care Management   Outreach Note  08/26/2019 Name: MYKAH BELLOMO MRN: [id number] DOB: August 18, 1931  Referred by: Remus Loffler, PA-C Reason for referral : Chronic Care Management (Care Coordination)   An unsuccessful telephone outreach was attempted today. The patient was referred to the case management team for assistance with care management and care coordination. Lorna Few, LCSW also attempted to reach-out to Ms Gagen daughters a couple of weeks ago.   Ms Ponciano has [employer] which has their own case management department. They do not cover CCM services through [facility].   I reached out to [facility] Case Management [phone] and spoke with a case manager. They have Ms Mozley scheduled for outreach tomorrow. I will mail a letter of explanation along with the [name] Case Management flyer to the patient for review by her and her family.   Follow Up Plan:  The CCM Team will follow-up with the patient/family over the next 30 days to verify that Case Management services have begun.   [name], BSN, RN-BC Embedded Chronic Care Manager [facility] / [facility] Care Management Direct Dial: [phone]

## 2019-09-10 ENCOUNTER — Ambulatory Visit: Payer: Self-pay | Admitting: Licensed Clinical Social Worker

## 2019-09-10 DIAGNOSIS — I10 Essential (primary) hypertension: Secondary | ICD-10-CM

## 2019-09-10 DIAGNOSIS — E119 Type 2 diabetes mellitus without complications: Secondary | ICD-10-CM

## 2019-09-10 DIAGNOSIS — E7800 Pure hypercholesterolemia, unspecified: Secondary | ICD-10-CM

## 2019-09-10 DIAGNOSIS — Z8679 Personal history of other diseases of the circulatory system: Secondary | ICD-10-CM

## 2019-09-10 NOTE — Patient Instructions (Addendum)
Licensed Clinical Actuary Provided: No  I reached out to [employer], daughter, by phone today   LCSW called client phone number and was not able to speak with client and was not able to leave phone message. LCSW called phone number for [name], daughter of client, and was not able to speak with [name]. LCSW did leave phone message for [name] requesting she return call to LCSW to discuss support services available for client  Follow Up Plan: LCSW to call client or daughter in next 4 weeks to talk with client/daughter about support services available  The patient [name], daughter, verbalized understanding of instructions provided today and declined a print copy of patient instruction materials.   [name].Destaney Sarkis MSW, LCSW Licensed Clinical Social Worker [facility] Care Management [phone]

## 2019-09-10 NOTE — Chronic Care Management (AMB) (Signed)
  Care Management Note   Julie Riley is a 86 y.o. year old female who is a primary care patient of Remus Loffler, PA-C. The CM team was consulted for assistance with chronic disease management and care coordination.   I reached out to Loney Loh, daughter, by phone today   Review of patient status, including review of consultants reports, relevant laboratory and other test results, and collaboration with appropriate care team members and the patient's provider was performed as part of comprehensive patient evaluation and provision of chronic care management services.   Medications   ALPRAZolam (XANAX) 0.25 MG tablet aspirin 325 MG EC tablet atorvastatin (LIPITOR) 10 MG tablet Cholecalciferol (VITAMIN D3) 50 MCG (2000 UT) capsule diltiazem (CARDIZEM) 30 MG tablet donepezil (ARICEPT) 5 MG tablet enalapril (VASOTEC) 20 MG tablet gabapentin (NEURONTIN) 300 MG capsule glucose blood test strip ketoconazole (NIZORAL) 2 % cream metFORMIN (GLUCOPHAGE) 500 MG tablet nystatin (MYCOSTATIN/NYSTOP) powder ReliOn Lancets Micro-Thin 33G MISC  LCSW called client phone number and was not able to speak with client and was not able to leave phone message. LCSW called phone number for [name], daughter of client, and was not able to speak with [name]. LCSW did leave phone message for [name] requesting she return call to LCSW to discuss support services available for client  Follow Up Plan: LCSW to call client or daughter in next 4 weeks to talk with client/daughter about support services available  [name].Pamila Mendibles MSW, LCSW Licensed Clinical Social Worker [facility] Care [facility] [phone]

## 2019-10-12 ENCOUNTER — Ambulatory Visit: Payer: Self-pay | Admitting: Licensed Clinical Social Worker

## 2019-10-12 DIAGNOSIS — E119 Type 2 diabetes mellitus without complications: Secondary | ICD-10-CM

## 2019-10-12 DIAGNOSIS — F0390 Unspecified dementia without behavioral disturbance: Secondary | ICD-10-CM

## 2019-10-12 DIAGNOSIS — I10 Essential (primary) hypertension: Secondary | ICD-10-CM

## 2019-10-12 DIAGNOSIS — E7800 Pure hypercholesterolemia, unspecified: Secondary | ICD-10-CM

## 2019-10-12 DIAGNOSIS — Z8679 Personal history of other diseases of the circulatory system: Secondary | ICD-10-CM

## 2019-10-12 NOTE — Patient Instructions (Addendum)
Licensed Clinical Social Worker Visit Information  Materials Provided: No  Julie Riley is a 86 y.o. year old female who is a primary care patient of [name], Julie Riley. The CCM team was consulted for assistance with Community resources   Review of patient status, including review of consultants reports, other relevant assessments, and collaboration with appropriate care team members and the patient's provider was performed as part of comprehensive patient evaluation and provision of chronic care management services.   SDOH (Social Determinants of Health) assessments performed: No       Outpatient Encounter Medications as of 10/12/2019  Medication Sig  . ALPRAZolam (XANAX) 0.25 MG tablet Take 0.5-1 tablets (0.125-0.25 mg total) by mouth 2 (two) times daily as needed for anxiety.  Marland Kitchen aspirin 325 MG EC tablet Take 325 mg by mouth daily.  Marland Kitchen atorvastatin (LIPITOR) 10 MG tablet Take 1 tablet (10 mg total) by mouth daily.  . Cholecalciferol (VITAMIN D3) 50 MCG (2000 UT) capsule Take 1 capsule (2,000 Units total) by mouth daily.  Marland Kitchen diltiazem (CARDIZEM) 30 MG tablet Take 1 tablet (30 mg total) by mouth 2 (two) times daily.  Marland Kitchen donepezil (ARICEPT) 5 MG tablet Take 1 tablet (5 mg total) by mouth at bedtime.  . enalapril (VASOTEC) 20 MG tablet Take 1 tablet (20 mg total) by mouth 2 (two) times daily.  Marland Kitchen gabapentin (NEURONTIN) 300 MG capsule Take 1 capsule (300 mg total) by mouth daily.  Marland Kitchen glucose blood test strip Use to check blood sugars two times daily  . ketoconazole (NIZORAL) 2 % cream Apply 1 application topically daily.   metFORMIN (GLUCOPHAGE) 500 MG tablet Take 2 tablets (1,000 mg total) by mouth 2 (two) times daily with a meal.  . nystatin (MYCOSTATIN/NYSTOP) powder Apply topically 4 (four) times daily.  . ReliOn Lancets Micro-Thin 33G MISC Use to check blood sugars two times daily   No facility-administered encounter medications on file as of 10/12/2019.   LCSW called client today and  spoke briefly with client. Client had hearing issues with her phone and asked for LCSW to call her daughter Julie Riley or her daughter Julie Riley to discuss client needs.LCSW called phone numbers for both daughters today and was not able to speak with either daughter via phone today. LCSW left phone messages for both daughters asking each daughter to return call to LCSW to discuss current client needs and to discuss CCM program services   Follow Up Plan: LCSW to call client/daughters in next 4 weeks to talk about CCM program services .  The patient verbalized understanding of instructions provided today and declined a print copy of patient instruction materials.   Julie Riley.Julie Riley MSW, LCSW Licensed Clinical Social Worker [facility] Care Management [phone]

## 2019-10-12 NOTE — Chronic Care Management (AMB) (Signed)
  Chronic Care Management    Clinical Social Work Follow Up Note  10/12/2019 Name: Julie Riley MRN: [id number] DOB: 1932-02-02  Julie Riley is a 86 y.o. year old female who is a primary care patient of [name], [name]. The CCM team was consulted for assistance with Community resources  Review of patient status, including review of consultants reports, other relevant assessments, and collaboration with appropriate care team members and the patient's provider was performed as part of comprehensive patient evaluation and provision of chronic care management services.    SDOH (Social Determinants of Health) assessments performed: No   Outpatient Encounter Medications as of 10/12/2019  Medication Sig  . ALPRAZolam (XANAX) 0.25 MG tablet Take 0.5-1 tablets (0.125-0.25 mg total) by mouth 2 (two) times daily as needed for anxiety.  Marland Kitchen aspirin 325 MG EC tablet Take 325 mg by mouth daily.  Marland Kitchen atorvastatin (LIPITOR) 10 MG tablet Take 1 tablet (10 mg total) by mouth daily.  . Cholecalciferol (VITAMIN D3) 50 MCG (2000 UT) capsule Take 1 capsule (2,000 Units total) by mouth daily.  Marland Kitchen diltiazem (CARDIZEM) 30 MG tablet Take 1 tablet (30 mg total) by mouth 2 (two) times daily.  Marland Kitchen donepezil (ARICEPT) 5 MG tablet Take 1 tablet (5 mg total) by mouth at bedtime.  . enalapril (VASOTEC) 20 MG tablet Take 1 tablet (20 mg total) by mouth 2 (two) times daily.  Marland Kitchen gabapentin (NEURONTIN) 300 MG capsule Take 1 capsule (300 mg total) by mouth daily.  Marland Kitchen glucose blood test strip Use to check blood sugars two times daily  . ketoconazole (NIZORAL) 2 % cream Apply 1 application topically daily.  . metFORMIN (GLUCOPHAGE) 500 MG tablet Take 2 tablets (1,000 mg total) by mouth 2 (two) times daily with a meal.  . nystatin (MYCOSTATIN/NYSTOP) powder Apply topically 4 (four) times daily.  . ReliOn Lancets Micro-Thin 33G MISC Use to check blood sugars two times daily   No facility-administered encounter medications on file  as of 10/12/2019.      LCSW called client today and spoke briefly with client. Client had hearing issues with her phone and asked for LCSW to call her daughter Julie Riley or her daughter Julie Riley to discuss client needs.LCSW called phone numbers for both daughters today and was not able to speak with either daughter via phone today. LCSW left phone messages for both daughters asking each daughter to return call to LCSW to discuss current client needs and to discuss CCM program services  Follow Up Plan: LCSW to call client/daughters in next 4 weeks to talk about CCM program services .  [name].Marvell Stavola MSW, LCSW Licensed Clinical Social Worker [facility] [phone]

## 2019-10-14 ENCOUNTER — Telehealth: Payer: Self-pay | Admitting: Physician Assistant

## 2019-10-15 NOTE — Telephone Encounter (Signed)
Called to advised of appt. No answer, left voicemail to return call.

## 2019-10-29 ENCOUNTER — Telehealth: Payer: Self-pay | Admitting: Family

## 2019-11-02 ENCOUNTER — Other Ambulatory Visit: Payer: Self-pay

## 2019-11-02 ENCOUNTER — Ambulatory Visit (INDEPENDENT_AMBULATORY_CARE_PROVIDER_SITE_OTHER): Payer: Medicare Other | Admitting: Family

## 2019-11-02 ENCOUNTER — Encounter: Payer: Self-pay | Admitting: Family

## 2019-11-02 VITALS — BP 134/60 | HR 64 | Temp 97.7°F | Ht 61.0 in | Wt 125.2 lb

## 2019-11-02 DIAGNOSIS — I10 Essential (primary) hypertension: Secondary | ICD-10-CM

## 2019-11-02 DIAGNOSIS — E119 Type 2 diabetes mellitus without complications: Secondary | ICD-10-CM | POA: Diagnosis not present

## 2019-11-02 DIAGNOSIS — N3281 Overactive bladder: Secondary | ICD-10-CM | POA: Diagnosis not present

## 2019-11-02 DIAGNOSIS — E559 Vitamin D deficiency, unspecified: Secondary | ICD-10-CM

## 2019-11-02 DIAGNOSIS — E7800 Pure hypercholesterolemia, unspecified: Secondary | ICD-10-CM

## 2019-11-02 DIAGNOSIS — G629 Polyneuropathy, unspecified: Secondary | ICD-10-CM | POA: Diagnosis not present

## 2019-11-02 DIAGNOSIS — R413 Other amnesia: Secondary | ICD-10-CM

## 2019-11-02 MED ORDER — DONEPEZIL HCL 10 MG PO TABS: 10.0000 mg | ORAL_TABLET | Freq: Every day | ORAL | 2 refills | Status: DC

## 2019-11-02 MED ORDER — MEMANTINE HCL 5 MG PO TABS: 5.0000 mg | ORAL_TABLET | Freq: Two times a day (BID) | ORAL | 2 refills | Status: DC

## 2019-11-02 NOTE — Progress Notes (Signed)
Subjective:    Patient ID: Julie Riley, female    DOB: 07-27-1931, 86 y.o.   MRN: [id number]  Chief Complaint  Patient presents with  . Dementia   Pt presents to the office today for chronic follow up. Pt denies any memory changes, but daughter states she is forgetful at times. She has been taking Aricept daily without improvement.  Hypertension This is a chronic problem. The current episode started more than 1 year ago. The problem has been resolved since onset. The problem is controlled. Pertinent negatives include no blurred vision, malaise/fatigue, peripheral edema or shortness of breath. Risk factors for coronary artery disease include dyslipidemia and sedentary lifestyle. Past treatments include calcium channel blockers. The current treatment provides moderate improvement.  Hyperlipidemia This is a chronic problem. The current episode started more than 1 year ago. The problem is uncontrolled. Pertinent negatives include no shortness of breath. Current antihyperlipidemic treatment includes statins. The current treatment provides moderate improvement of lipids. Risk factors for coronary artery disease include dyslipidemia, diabetes mellitus, hypertension and a sedentary lifestyle.  Diabetes She presents for her follow-up diabetic visit. She has type 2 diabetes mellitus. There are no hypoglycemic associated symptoms. Pertinent negatives for diabetes include no blurred vision and no foot paresthesias. There are no hypoglycemic complications. Symptoms are stable. Risk factors for coronary artery disease include dyslipidemia, diabetes mellitus, hypertension, sedentary lifestyle and post-menopausal. Her overall blood glucose range is 90-110 mg/dl. An ACE inhibitor/angiotensin II receptor blocker is being taken. Eye exam is not current.      Review of Systems  Constitutional: Negative for malaise/fatigue.  Eyes: Negative for blurred vision.  Respiratory: Negative for shortness of breath.     All other systems reviewed and are negative.      Objective:   Physical Exam Vitals reviewed.  Constitutional:      General: She is not in acute distress.    Appearance: She is well-developed.  HENT:     Head: Normocephalic and atraumatic.     Right Ear: Tympanic membrane normal.     Left Ear: Tympanic membrane normal.  Eyes:     Pupils: Pupils are equal, round, and reactive to light.  Neck:     Thyroid: No thyromegaly.  Cardiovascular:     Rate and Rhythm: Normal rate and regular rhythm.     Heart sounds: Normal heart sounds. No murmur.  Pulmonary:     Effort: Pulmonary effort is normal. No respiratory distress.     Breath sounds: Normal breath sounds. No wheezing.  Abdominal:     General: Bowel sounds are normal. There is no distension.     Palpations: Abdomen is soft.     Tenderness: There is no abdominal tenderness.  Musculoskeletal:        General: No tenderness. Normal range of motion.     Cervical back: Normal range of motion and neck supple.  Skin:    General: Skin is warm and dry.  Neurological:     Mental Status: She is alert and oriented to person, place, and time.     Cranial Nerves: No cranial nerve deficit.     Deep Tendon Reflexes: Reflexes are normal and symmetric.  Psychiatric:        Behavior: Behavior normal.        Thought Content: Thought content normal.        Judgment: Judgment normal.      BP 134/60   Pulse 64   Temp 97.7 F (36.5 C) (Temporal)  Ht 5' 1" (1.549 m)   Wt 125 lb 3.2 oz (56.8 kg)   SpO2 99%   BMI 23.66 kg/m       Assessment & Plan:  [name] comes in today with chief complaint of Dementia   Diagnosis and orders addressed:  1. Essential hypertension - CMP14+EGFR - CBC with Differential/Platelet  2. Type 2 diabetes mellitus without complication, without long-term current use of insulin (HCC) - CMP14+EGFR - CBC with Differential/Platelet - Bayer DCA Hb A1c Waived  3. OAB (overactive bladder) -  CMP14+EGFR - CBC with Differential/Platelet  4. Neuropathy - CMP14+EGFR - CBC with Differential/Platelet  5. Pure hypercholesterolemia - CMP14+EGFR - CBC with Differential/Platelet  6. Vitamin D deficiency - CMP14+EGFR - CBC with Differential/Platelet  7. Memory changes Will add namenda 5 mg and will increase aricept 10 mg  - donepezil (ARICEPT) 10 MG tablet; Take 1 tablet (10 mg total) by mouth at bedtime.  Dispense: 90 tablet; Refill: 2 - memantine (NAMENDA) 5 MG tablet; Take 1 tablet (5 mg total) by mouth 2 (two) times daily.  Dispense: 60 tablet; Refill: 2 - CMP14+EGFR - CBC with Differential/Platelet   Labs pending Health Maintenance reviewed Diet and exercise encouraged  Follow up plan: 4 months     , FNP  

## 2019-11-02 NOTE — Patient Instructions (Signed)

## 2019-11-03 LAB — BAYER DCA HB A1C WAIVED: HB A1C (BAYER DCA - WAIVED): 7 % — ABNORMAL HIGH (ref <7.0)

## 2019-11-04 LAB — CMP14+EGFR
ALT: 12 IU/L (ref 0–32)
AST: 18 IU/L (ref 0–40)
Albumin/Globulin Ratio: 1.9 (ref 1.2–2.2)
Albumin: 4 g/dL (ref 3.6–4.6)
Alkaline Phosphatase: 87 IU/L (ref 39–117)
BUN/Creatinine Ratio: 28 (ref 12–28)
BUN: 19 mg/dL (ref 8–27)
Bilirubin Total: 0.3 mg/dL (ref 0.0–1.2)
CO2: 28 mmol/L (ref 20–29)
Calcium: 9.1 mg/dL (ref 8.7–10.3)
Chloride: 103 mmol/L (ref 96–106)
Creatinine, Ser: 0.67 mg/dL (ref 0.57–1.00)
GFR calc Af Amer: 92 mL/min/{1.73_m2} (ref >59)
GFR calc non Af Amer: 80 mL/min/{1.73_m2} (ref >59)
Globulin, Total: 2.1 g/dL (ref 1.5–4.5)
Glucose: 55 mg/dL — ABNORMAL LOW (ref 65–99)
Potassium: 4.6 mmol/L (ref 3.5–5.2)
Sodium: 143 mmol/L (ref 134–144)
Total Protein: 6.1 g/dL (ref 6.0–8.5)

## 2019-11-04 LAB — CBC WITH DIFFERENTIAL/PLATELET
Basophils Absolute: 0.1 10*3/uL (ref 0.0–0.2)
Basos: 1 %
EOS (ABSOLUTE): 0.3 10*3/uL (ref 0.0–0.4)
Eos: 5 %
Hematocrit: 37 % (ref 34.0–46.6)
Hemoglobin: 12.7 g/dL (ref 11.1–15.9)
Immature Grans (Abs): 0 10*3/uL (ref 0.0–0.1)
Immature Granulocytes: 0 %
Lymphocytes Absolute: 1.9 10*3/uL (ref 0.7–3.1)
Lymphs: 26 %
MCH: 31.6 pg (ref 26.6–33.0)
MCHC: 34.3 g/dL (ref 31.5–35.7)
MCV: 92 fL (ref 79–97)
Monocytes Absolute: 0.7 10*3/uL (ref 0.1–0.9)
Monocytes: 9 %
Neutrophils Absolute: 4.3 10*3/uL (ref 1.4–7.0)
Neutrophils: 59 %
Platelets: 219 10*3/uL (ref 150–450)
RBC: 4.02 x10E6/uL (ref 3.77–5.28)
RDW: 12.7 % (ref 11.7–15.4)
WBC: 7.2 10*3/uL (ref 3.4–10.8)

## 2019-11-05 ENCOUNTER — Other Ambulatory Visit: Payer: Self-pay | Admitting: Family

## 2019-11-05 DIAGNOSIS — E119 Type 2 diabetes mellitus without complications: Secondary | ICD-10-CM

## 2019-11-05 MED ORDER — METFORMIN HCL 500 MG PO TABS: 500.0000 mg | ORAL_TABLET | Freq: Every day | ORAL | 3 refills | Status: DC

## 2019-11-10 ENCOUNTER — Telehealth: Payer: Self-pay | Admitting: *Deleted

## 2019-11-10 NOTE — Telephone Encounter (Signed)
Tried calling daughter. No answer and mailbox full

## 2019-11-10 NOTE — Telephone Encounter (Signed)
Patients daughter wants to verify that you want patient to decrease metformin to 500mg  daily. Patient was taking 1000mg  BID.

## 2019-11-10 NOTE — Telephone Encounter (Signed)
Yes, but if she would feel more comfortable she could take 500 mg BID. Her glucose was too low on her CMP. I would rather her glucose be a littler higher then too low.

## 2019-11-11 ENCOUNTER — Ambulatory Visit: Payer: Self-pay | Admitting: Licensed Clinical Social Worker

## 2019-11-11 DIAGNOSIS — E119 Type 2 diabetes mellitus without complications: Secondary | ICD-10-CM

## 2019-11-11 DIAGNOSIS — I10 Essential (primary) hypertension: Secondary | ICD-10-CM

## 2019-11-11 DIAGNOSIS — E7800 Pure hypercholesterolemia, unspecified: Secondary | ICD-10-CM

## 2019-11-11 DIAGNOSIS — R413 Other amnesia: Secondary | ICD-10-CM

## 2019-11-11 DIAGNOSIS — F0390 Unspecified dementia without behavioral disturbance: Secondary | ICD-10-CM

## 2019-11-11 NOTE — Patient Instructions (Addendum)
Licensed Clinical Social Worker Visit Information  Materials Provided: No  11/11/2019   Name: SONJIA WILCOXSON MRN: [id number] DOB: [birth year]   MARIS ABASCAL is a 86 y.o. year old female who is a primary care patient of [name], FNP. The CCM team was consulted for assistance with [name] .   Review of patient status, including review of consultants reports, other relevant assessments, and collaboration with appropriate care team members and the patient's provider was performed as part of comprehensive patient evaluation and provision of chronic care management services.   SDOH (Social Determinants of Health) assessments performed: No;risk for physical inactivity ; needs help managing finances    LCSW called [name], daughter of client, today and talked with [name] about client needs. [name] said client was having memory challenges. [name] said she was trying to schedule a call with [name], FNP to discuss current client needs. [name] RNCM had previously called [name] Case management department to discuss client needs. [name] had told [name] on 08/26/2019 that [name] representative would call client on 08/27/2019 to talk with [name] about her needs. [name], daughter , said she had not heard of call from [facility] to client on 08/27/2019. Again, client has some memory issues so perhaps [name] representative called client on 08/27/2019 and client may have forgotten about this call with [name] representative. LCSW gave Norely Schlick the [facility] Case Management Department phone number of [phone]. [name] wrote down this number to use as needed. [name] said that family members were trying to work together to meet the daily care needs of client. LCSW thanked Cleta Heatley for phone call with LCSW on 11/11/2019.   Follow Up Plan: LCSW to call client or Kashia Brossard in next 4 weeks to assess client needs at that time.  The patient Jolinda Pinkstaff, daughter, verbalized understanding of  instructions provided today and declined a print copy of patient instruction materials.   [name].Natsha Guidry MSW, LCSW Licensed Clinical Social Worker [facility] Care Management [phone]

## 2019-11-11 NOTE — Telephone Encounter (Signed)
Patient daughter aware. °

## 2019-11-11 NOTE — Chronic Care Management (AMB) (Addendum)
  Chronic Care Management    Clinical Social Work Follow Up Note  11/11/2019 Name: [name] MRN: [id number] DOB: 25-Aug-1931  Julie Riley is a 86 y.o. year old female who is a primary care patient of Julie Spencer, FNP. The CCM team was consulted for assistance with Julie Riley .   Review of patient status, including review of consultants reports, other relevant assessments, and collaboration with appropriate care team members and the patient's provider was performed as part of comprehensive patient evaluation and provision of chronic care management services.    SDOH (Social Determinants of Health) assessments performed: No;risk for physical inactivity ; needs help managing finances    Outpatient Encounter Medications as of 11/11/2019  Medication Sig   aspirin 325 MG EC tablet Take 325 mg by mouth daily.   atorvastatin (LIPITOR) 10 MG tablet Take 1 tablet (10 mg total) by mouth daily.   Cholecalciferol (VITAMIN D3) 50 MCG (2000 UT) capsule Take 1 capsule (2,000 Units total) by mouth daily.   diltiazem (CARDIZEM) 30 MG tablet Take 1 tablet (30 mg total) by mouth 2 (two) times daily.   donepezil (ARICEPT) 10 MG tablet Take 1 tablet (10 mg total) by mouth at bedtime.   enalapril (VASOTEC) 20 MG tablet Take 1 tablet (20 mg total) by mouth 2 (two) times daily.   gabapentin (NEURONTIN) 300 MG capsule Take 1 capsule (300 mg total) by mouth daily.   glucose blood test strip Use to check blood sugars two times daily   memantine (NAMENDA) 5 MG tablet Take 1 tablet (5 mg total) by mouth 2 (two) times daily.   metFORMIN (GLUCOPHAGE) 500 MG tablet Take 1 tablet (500 mg total) by mouth daily with breakfast.   ReliOn Lancets Micro-Thin 33G MISC Use to check blood sugars two times daily   No facility-administered encounter medications on file as of 11/11/2019.    LCSW called Julie Riley, daughter of client, today and talked with Julie Riley about client needs. Julie Riley said client was having  memory challenges. Julie Riley said she was trying to schedule a call with Julie Spencer, FNP to discuss current client needs. Julie Riley RNCM had previously called Julie Riley to discuss client needs. Julie had told Julie Riley on 08/26/2019 that Julie representative would call client on 08/27/2019 to talk with Julie Riley about her needs. Julie Riley, daughter , said she had not heard of call from Julie Riley to client on 08/27/2019. Again, client has some memory issues so perhaps Julie representative called client on 08/27/2019 and client may have forgotten about this call with Julie representative. LCSW gave Julie Riley the Julie Riley phone number of [phone]. Julie Riley wrote down this number to use as needed. Julie Riley said that family members were trying to work together to meet the daily care needs of client. LCSW thanked Julie Riley for phone call with LCSW on 11/11/2019.   Follow Up Plan: LCSW to call client or Julie Riley in next 4 weeks to assess client needs at that time.  Julie Riley.Julie Riley MSW, LCSW Licensed Clinical Social Worker [facility] Care Management [phone]  I have reviewed and agree with the above  documentation.   Julie Rodney, FNP

## 2019-11-16 ENCOUNTER — Ambulatory Visit (INDEPENDENT_AMBULATORY_CARE_PROVIDER_SITE_OTHER): Payer: Medicare Other | Admitting: Family

## 2019-11-16 ENCOUNTER — Encounter: Payer: Self-pay | Admitting: Family

## 2019-11-16 DIAGNOSIS — R413 Other amnesia: Secondary | ICD-10-CM

## 2019-11-16 DIAGNOSIS — F0390 Unspecified dementia without behavioral disturbance: Secondary | ICD-10-CM

## 2019-11-16 NOTE — Progress Notes (Signed)
   Virtual Visit via telephone Note Due to COVID-19 pandemic this visit was conducted virtually. This visit type was conducted due to national recommendations for restrictions regarding the COVID-19 Pandemic (e.g. social distancing, sheltering in place) in an effort to limit this patient's exposure and mitigate transmission in our community. All issues noted in this document were discussed and addressed.  A physical exam was not performed with this format.  I connected with Julie Riley's Riley on 11/16/19 at 3:55 pm by telephone and verified that I am speaking with the correct person using two identifiers. Julie Riley is currently located at in the car and no one is currently with her during visit. The provider, Jannifer Rodney, FNP is located in their office at time of visit.  I discussed the limitations, risks, security and privacy concerns of performing an evaluation and management service by telephone and the availability of in person appointments. I also discussed with the patient that there may be a patient responsible charge related to this service. The patient expressed understanding and agreed to proceed.   History and Present Illness:  HPI  Pt's Riley calls the office today with complaints of dementia. She reports her mother is sending checks out that aren't due. On her last visit we added Namenda with aricept.   Riley states her mother is very stubborn and does not want them added as her POA because she does not want to admit to any memory changes.   She states they have disconnected her car so she could not drive.   Review of Systems  Unable to perform ROS: Dementia  All other systems reviewed and are negative.    Observations/Objective: No SOB or distress noted   Assessment and Plan: 1. Memory changes   2. Dementia without behavioral disturbance, unspecified dementia type [facility])  Letter written that patient can not make legal decisions. Riley will  take to the lawyer to try to get POA, to help with bills and household items.      I discussed the assessment and treatment plan with the patient. The patient was provided an opportunity to ask questions and all were answered. The patient agreed with the plan and demonstrated an understanding of the instructions.   The patient was advised to call back or seek an in-person evaluation if the symptoms worsen or if the condition fails to improve as anticipated.  The above assessment and management plan was discussed with the patient. The patient verbalized understanding of and has agreed to the management plan. Patient is aware to call the clinic if symptoms persist or worsen. Patient is aware when to return to the clinic for a follow-up visit. Patient educated on when it is appropriate to go to the emergency department.   Time call ended: 4:16 pm   I provided 21 minutes of non-face-to-face time during this encounter.    Jannifer Rodney, FNP

## 2019-11-25 ENCOUNTER — Ambulatory Visit: Payer: Medicare Other | Admitting: Family

## 2019-12-16 ENCOUNTER — Ambulatory Visit: Payer: Self-pay | Admitting: Licensed Clinical Social Worker

## 2019-12-16 DIAGNOSIS — R413 Other amnesia: Secondary | ICD-10-CM

## 2019-12-16 DIAGNOSIS — Z8679 Personal history of other diseases of the circulatory system: Secondary | ICD-10-CM

## 2019-12-16 DIAGNOSIS — E119 Type 2 diabetes mellitus without complications: Secondary | ICD-10-CM

## 2019-12-16 DIAGNOSIS — E559 Vitamin D deficiency, unspecified: Secondary | ICD-10-CM

## 2019-12-16 DIAGNOSIS — I10 Essential (primary) hypertension: Secondary | ICD-10-CM

## 2019-12-16 DIAGNOSIS — G629 Polyneuropathy, unspecified: Secondary | ICD-10-CM

## 2019-12-16 DIAGNOSIS — F0390 Unspecified dementia without behavioral disturbance: Secondary | ICD-10-CM

## 2019-12-16 NOTE — Patient Instructions (Addendum)
Licensed Clinical Social Worker Visit Information  Materials Provided: No  12/16/2019  Name: Julie Riley       MRN: [id number]       DOB: 07/04/1931  Julie Riley is a 86 y.o. year old female who is a primary care patient of [name], FNP. The CCM team was consulted for assistance with [name] .   Review of patient status, including review of consultants reports, other relevant assessments, and collaboration with appropriate care team members and the patient's provider was performed as part of comprehensive patient evaluation and provision of chronic care management services.    SDOH (Social Determinants of Health) assessments performed: No;risk for tobacco use; risk for depression; risk for stress   LCSW called Julie Riley, daughter of client today.  LCSW spoke briefly with Julie Riley via phone today. Julie asked LCSW if she could return call to LCSW in next few days to talk further at that time about client needs. LCSW agreed to plan   Follow Up Plan: LCSW to call client or Julie Riley in next 4 weeks to talk with client or [name] about CCM program support services  The patient /Julie Riley, daughter, verbalized understanding of instructions provided today and declined a print copy of patient instruction materials.   Julie Riley.Julie Riley MSW, LCSW Licensed Clinical Social Worker [facility] Care Management [phone]

## 2019-12-16 NOTE — Chronic Care Management (AMB) (Addendum)
  Chronic Care Management    Clinical Social Work Follow Up Note  12/16/2019 Name: Julie Riley MRN: [id number] DOB: 01/16/1932  Julie Riley is a 86 y.o. year old female who is a primary care patient of Julie Spencer, FNP. The CCM team was consulted for assistance with Julie Riley .   Review of patient status, including review of consultants reports, other relevant assessments, and collaboration with appropriate care team members and the patient's provider was performed as part of comprehensive patient evaluation and provision of chronic care management services.    SDOH (Social Determinants of Health) assessments performed: No;risk for tobacco use; risk for depression; risk for stress     Outpatient Encounter Medications as of 12/16/2019  Medication Sig   aspirin 325 MG EC tablet Take 325 mg by mouth daily.   atorvastatin (LIPITOR) 10 MG tablet Take 1 tablet (10 mg total) by mouth daily.   Cholecalciferol (VITAMIN D3) 50 MCG (2000 UT) capsule Take 1 capsule (2,000 Units total) by mouth daily.   diltiazem (CARDIZEM) 30 MG tablet Take 1 tablet (30 mg total) by mouth 2 (two) times daily.   donepezil (ARICEPT) 10 MG tablet Take 1 tablet (10 mg total) by mouth at bedtime.   enalapril (VASOTEC) 20 MG tablet Take 1 tablet (20 mg total) by mouth 2 (two) times daily.   gabapentin (NEURONTIN) 300 MG capsule Take 1 capsule (300 mg total) by mouth daily.   glucose blood test strip Use to check blood sugars two times daily   memantine (NAMENDA) 5 MG tablet Take 1 tablet (5 mg total) by mouth 2 (two) times daily.   metFORMIN (GLUCOPHAGE) 500 MG tablet Take 1 tablet (500 mg total) by mouth daily with breakfast.   ReliOn Lancets Micro-Thin 33G MISC Use to check blood sugars two times daily   No facility-administered encounter medications on file as of 12/16/2019.    LCSW called Julie Riley, daughter of client today.  LCSW spoke briefly with Julie Riley via phone today. Julie Riley asked  LCSW if she could return call to LCSW in next few days to talk further at that time about client needs. LCSW agreed to plan   Follow Up Plan: LCSW to call client or Julie Riley in next 4 weeks to talk with client or Julie Riley about CCM program support services  Julie Riley.Julie Riley MSW, LCSW Licensed Clinical Social Worker [facility] Care [facility] [phone]  Health Maintenance  Topic Date Due   OPHTHALMOLOGY EXAM  09/03/2019   DEXA SCAN  11/01/2020 (Originally 08/27/1998)   TETANUS/TDAP  11/01/2020 (Originally 08/26/1952)   PNA vac Low Risk Adult (1 of 2 - PCV13) 11/01/2020 (Originally 08/27/1998)   INFLUENZA VACCINE  01/24/2020   HEMOGLOBIN A1C  05/04/2020   FOOT EXAM  11/01/2020   COVID-19 Vaccine  Completed   I have reviewed the CCM documentation and agree with the written assessment and plan of care.  Julie Rodney, FNP

## 2019-12-18 ENCOUNTER — Ambulatory Visit (INDEPENDENT_AMBULATORY_CARE_PROVIDER_SITE_OTHER): Payer: Medicare Other

## 2019-12-18 VITALS — BP 141/72 | HR 75

## 2019-12-18 DIAGNOSIS — Z0000 Encounter for general adult medical examination without abnormal findings: Secondary | ICD-10-CM

## 2019-12-18 NOTE — Progress Notes (Addendum)
MEDICARE ANNUAL WELLNESS VISIT  12/18/2019  Telephone Visit Disclaimer This Medicare AWV was conducted by telephone due to national recommendations for restrictions regarding the COVID-19 Pandemic (e.g. social distancing).  I verified, using two identifiers, that I am speaking with Julie Riley or their authorized healthcare agent. I discussed the limitations, risks, security, and privacy concerns of performing an evaluation and management service by telephone and the potential availability of an in-person appointment in the future. The patient expressed understanding and agreed to proceed.   Subjective:  Julie Riley is a 86 y.o. female patient of Hawks, Edilia Bo, FNP who had a Medicare Annual Wellness Visit today via telephone. Julie Riley is Retired and lives alone. she has six children. she reports that she is socially active and does interact with friends/family regularly. she is minimally physically active and enjoys sewing.  Patient Care Team: [name], FNP as PCP - General (Family Medicine) Randa Spike, [name], LCSW as Social Worker (Licensed Clinical Social Worker)  Advanced Directives 12/18/2019 11/26/2018  Does Patient Have a Medical Advance Directive? No No  Does patient want to make changes to medical advance directive? No - Patient declined -  Would patient like information on creating a medical advance directive? No - Patient declined Yes (MAU/Ambulatory/Procedural Areas - Information given)    Hospital Utilization Over the Past 12 Months: # of hospitalizations or ER visits: 0 # of surgeries: 0  Review of Systems    Patient reports that her overall health is unchanged compared to last year.    Patient Reported Readings (BP-141/72, Pulse-75, CBG-92, Weight, etc)   Pain Assessment Pain : No/denies pain     Current Medications & Allergies (verified) Allergies as of 12/18/2019       Reactions   Amoxicillin    Penicillins    Xarelto [rivaroxaban] Other  (See Comments)   Severe diarrhea        Medication List        Accurate as of December 18, 2019  8:50 AM. If you have any questions, ask your nurse or doctor.          aspirin 325 MG EC tablet Take 325 mg by mouth daily.   atorvastatin 10 MG tablet Commonly known as: LIPITOR Take 1 tablet (10 mg total) by mouth daily.   diltiazem 30 MG tablet Commonly known as: CARDIZEM Take 1 tablet (30 mg total) by mouth 2 (two) times daily.   donepezil 10 MG tablet Commonly known as: Aricept Take 1 tablet (10 mg total) by mouth at bedtime.   enalapril 20 MG tablet Commonly known as: VASOTEC Take 1 tablet (20 mg total) by mouth 2 (two) times daily.   gabapentin 300 MG capsule Commonly known as: NEURONTIN Take 1 capsule (300 mg total) by mouth daily.   glucose blood test strip Use to check blood sugars two times daily   memantine 5 MG tablet Commonly known as: Namenda Take 1 tablet (5 mg total) by mouth 2 (two) times daily.   metFORMIN 500 MG tablet Commonly known as: GLUCOPHAGE Take 1 tablet (500 mg total) by mouth daily with breakfast.   ReliOn Lancets Micro-Thin 33G Misc Use to check blood sugars two times daily   Vitamin D3 50 MCG (2000 UT) capsule Take 1 capsule (2,000 Units total) by mouth daily.        History (reviewed): Past Medical History:  Diagnosis Date   CAD (coronary artery disease)    nonobsturctive.    Carotid  stenosis    Less than 50% bilateral ICA stenosis, 2008.   Diabetes mellitus (HCC)    HLD (hyperlipidemia)    HTN (hypertension)    Past Surgical History:  Procedure Laterality Date   APPENDECTOMY     CHOLECYSTECTOMY     partial hysterectomy --- unknown     Family History  Problem Relation Age of Onset   Heart attack Father        cause of death   Social History   Socioeconomic History   Marital status: Married    Spouse name: Remi Deter   Number of children: 6   Years of education: Not on file   Highest education level: Not on  file  Occupational History   Occupation: RETIRED  Tobacco Use   Smoking status: Never Smoker   Smokeless tobacco: Never Used  Building services engineer Use: Never used  Substance and Sexual Activity   Alcohol use: No   Drug use: No   Sexual activity: Not Currently  Other Topics Concern   Not on file  Social History Narrative   Not on file   Social Determinants of Health   Financial Resource Strain:    Difficulty of Paying Living Expenses:   Food Insecurity:    Worried About Programme researcher, broadcasting/film/video in the Last Year:    Barista in the Last Year:   Transportation Needs:    Freight forwarder (Medical):    Lack of Transportation (Non-Medical):   Physical Activity:    Days of Exercise per Week:    Minutes of Exercise per Session:   Stress:    Feeling of Stress :   Social Connections:    Frequency of Communication with Friends and Family:    Frequency of Social Gatherings with Friends and Family:    Attends Religious Services:    Active Member of Clubs or Organizations:    Attends Banker Meetings:    Marital Status:     Activities of Daily Living In your present state of health, do you have any difficulty performing the following activities: 12/18/2019  Hearing? N  Vision? N  Difficulty concentrating or making decisions? N  Walking or climbing stairs? Y  Dressing or bathing? N  Doing errands, shopping? Y  Preparing Food and eating ? N  Using the Toilet? N  In the past six months, have you accidently leaked urine? Y  Do you have problems with loss of bowel control? N  Managing your Medications? N  Managing your Finances? Y  Housekeeping or managing your Housekeeping? Y  Some recent data might be hidden    Patient Education/ Literacy How often do you need to have someone help you when you read instructions, pamphlets, or other written materials from your doctor or pharmacy?: 2 - Rarely What is the last grade level you completed in school?:  9th  Exercise Current Exercise Habits: The patient does not participate in regular exercise at present  Diet Patient reports consuming 3 meals a day and 2 snack(s) a day Patient reports that her primary diet is: Diabetic Patient reports that she does have regular access to food.   Depression Screen PHQ 2/9 Scores 12/18/2019 11/02/2019 11/26/2018 04/22/2018 01/28/2018 01/16/2018 01/07/2018  PHQ - 2 Score 2 2 0 0 0 0 0  PHQ- 9 Score 2 2 - - - - -     Fall Risk Fall Risk  12/18/2019 11/02/2019 11/26/2018 04/22/2018 01/28/2018  Falls in the past year?  1 1 1  No No  Number falls in past yr: 0 0 0 - -  Injury with Fall? 1 1 1  - -  Risk for fall due to : Impaired mobility Impaired balance/gait History of fall(s) - -  Follow up Falls evaluation completed - Education provided - -     Objective:  Julie Riley seemed alert and oriented and she participated appropriately during our telephone visit.  Blood Pressure Weight BMI  BP Readings from Last 3 Encounters:  12/18/19 (!) 141/72  11/02/19 134/60  07/20/19 135/70   Wt Readings from Last 3 Encounters:  11/02/19 125 lb 3.2 oz (56.8 kg)  07/20/19 123 lb 3.2 oz (55.9 kg)  11/26/18 132 lb (59.9 kg)   BMI Readings from Last 1 Encounters:  11/02/19 23.66 kg/m    *Unable to obtain current vital signs, weight, and BMI due to telephone visit type  Hearing/Vision  01/26/19 did not seem to have difficulty with hearing/understanding during the telephone conversation Reports that she has had a formal eye exam by an eye care professional within the past year Reports that she has not had a formal hearing evaluation within the past year *Unable to fully assess hearing and vision during telephone visit type  Cognitive Function: 6CIT Screen 12/18/2019 11/26/2018  What Year? 0 points 0 points  What month? 0 points 0 points  What time? 0 points 0 points  Count back from 20 0 points 0 points  Months in reverse 0 points 0 points  Repeat phrase 2 points 0 points   Total Score 2 0   (Normal:0-7, Significant for Dysfunction: >8)  Normal Cognitive Function Screening: Yes   Immunization & Health Maintenance Record Immunization History  Administered Date(s) Administered   Influenza, High Dose Seasonal PF 04/15/2018   Moderna SARS-COVID-2 Vaccination 07/27/2019, 08/24/2019    Health Maintenance  Topic Date Due   OPHTHALMOLOGY EXAM  09/03/2019   DEXA SCAN  11/01/2020 (Originally 08/27/1998)   TETANUS/TDAP  11/01/2020 (Originally 08/26/1952)   PNA vac Low Risk Adult (1 of 2 - PCV13) 11/01/2020 (Originally 08/27/1998)   INFLUENZA VACCINE  01/24/2020   HEMOGLOBIN A1C  05/04/2020   FOOT EXAM  11/01/2020   COVID-19 Vaccine  Completed       Assessment  This is a routine wellness examination for 13/03/2020.  Health Maintenance: Due or Overdue Health Maintenance Due  Topic Date Due   OPHTHALMOLOGY EXAM  09/03/2019    Julie Riley does not need a referral for Community Assistance: Care Management:   no Social Work:    no Prescription Assistance:  no Nutrition/Diabetes Education:  no   Plan:  Personalized Goals Goals Addressed             This Visit's Progress    Prevent falls         Personalized Health Maintenance & Screening Recommendations    Lung Cancer Screening Recommended: no (Low Dose CT Chest recommended if Age 72-80 years, 30 pack-year currently smoking OR have quit w/in past 15 years) Hepatitis C Screening recommended: no HIV Screening recommended: no  Advanced Directives: Written information was not prepared per patient's request.  Referrals & Orders No orders of the defined types were placed in this encounter.   Follow-up Plan Follow-up with 11/03/2019, FNP as planned Schedule 03/16/2020   I have personally reviewed and noted the following in the patient's chart:   Medical and social history Use of alcohol, tobacco or illicit drugs  Current medications  and supplements Functional ability and  status Nutritional status Physical activity Advanced directives List of other physicians Hospitalizations, surgeries, and ER visits in previous 12 months Vitals Screenings to include cognitive, depression, and falls Referrals and appointments  In addition, I have reviewed and discussed with Julie Riley certain preventive protocols, quality metrics, and best practice recommendations. A written personalized care plan for preventive services as well as general preventive health recommendations is available and can be mailed to the patient at her request.      Julie Riley [facility]  7/82/4235     I have reviewed and agree with the above AWV documentation.    Evelina Dun, FNP

## 2019-12-18 NOTE — Patient Instructions (Signed)
°  MEDICARE ANNUAL WELLNESS VISIT Health Maintenance Summary and Written Plan of Care  Julie Riley ,  Thank you for allowing me to perform your Medicare Annual Wellness Visit and for your ongoing commitment to your health.   Health Maintenance & Immunization History Health Maintenance  Topic Date Due   OPHTHALMOLOGY EXAM  09/03/2019   DEXA SCAN  11/01/2020 (Originally 08/27/1998)   TETANUS/TDAP  11/01/2020 (Originally 08/26/1952)   PNA vac Low Risk Adult (1 of 2 - PCV13) 11/01/2020 (Originally 08/27/1998)   INFLUENZA VACCINE  01/24/2020   HEMOGLOBIN A1C  05/04/2020   FOOT EXAM  11/01/2020   COVID-19 Vaccine  Completed   Immunization History  Administered Date(s) Administered   Influenza, High Dose Seasonal PF 04/15/2018   Moderna SARS-COVID-2 Vaccination 07/27/2019, 08/24/2019    These are the patient goals that we discussed: Goals Addressed            This Visit's Progress    Prevent falls          This is a list of Health Maintenance Items that are overdue or due now: Health Maintenance Due  Topic Date Due   OPHTHALMOLOGY EXAM  09/03/2019     Orders/Referrals Placed Today: No orders of the defined types were placed in this encounter.  (Contact our referral department at [phone] if you have not spoken with someone about your referral appointment within the next 5 days)    Follow-up Plan  Scheduled with Jannifer Rodney, FNP 03/16/2020 at 8:25am. Please call and reschedule if this appointment is not appropriate for your schedule.

## 2020-01-21 ENCOUNTER — Telehealth: Payer: Self-pay

## 2020-02-16 ENCOUNTER — Other Ambulatory Visit: Payer: Self-pay | Admitting: Family

## 2020-02-16 DIAGNOSIS — R413 Other amnesia: Secondary | ICD-10-CM

## 2020-02-24 ENCOUNTER — Encounter: Payer: Self-pay | Admitting: *Deleted

## 2020-02-25 ENCOUNTER — Ambulatory Visit: Payer: Medicare Other | Admitting: Licensed Clinical Social Worker

## 2020-02-25 DIAGNOSIS — E559 Vitamin D deficiency, unspecified: Secondary | ICD-10-CM

## 2020-02-25 DIAGNOSIS — E119 Type 2 diabetes mellitus without complications: Secondary | ICD-10-CM

## 2020-02-25 DIAGNOSIS — F0390 Unspecified dementia without behavioral disturbance: Secondary | ICD-10-CM

## 2020-02-25 DIAGNOSIS — Z8679 Personal history of other diseases of the circulatory system: Secondary | ICD-10-CM

## 2020-02-25 DIAGNOSIS — I10 Essential (primary) hypertension: Secondary | ICD-10-CM

## 2020-02-25 NOTE — Patient Instructions (Addendum)
Licensed Clinical Social Worker Visit Information  Materials Provided: No  02/25/2020  Name: Julie Riley       MRN: [id number]       DOB: [birth year]  Julie Riley is a 86 y.o. year old female who is a primary care patient of [name], FNP. The CCM team was consulted for assistance with [name] .   Review of patient status, including review of consultants reports, other relevant assessments, and collaboration with appropriate care team members and the patient's provider was performed as part of comprehensive patient evaluation and provision of chronic care management services.    SDOH (Social Determinants of Health) assessments performed: No;risk for tobacco use; risk for depression; risk for stress; risk for physical inactivity  LCSW called client home phone number today but was not able to talk via phone with client. LCSW left phone message for client to please return call to LCSW at [phone]. LCSW also called phone number for [name], daughter of client today, but LCSW was not able to speak via phone with Julie Riley today. LCSW did leave phone message for Julie Riley asking her to please return call to LCSW at [phone]   Follow Up Plan: LCSW to call client or daughter of client in next 4 weeks to talk with client or her daughter about CCM program support services  LCSW was not able to speak via phone today with client or daughter, Julie Riley, thus, the patient or her daughter were not able to verbalize understanding of instructions provided today and were not able to accept or decline a print copy of patient instruction materials.   [name].Garion Wempe MSW, LCSW Licensed Clinical Social Worker [facility] [phone]

## 2020-02-25 NOTE — Chronic Care Management (AMB) (Addendum)
  Chronic Care Management    Clinical Social Work Follow Up Note  02/25/2020 Name: Julie Riley MRN: [id number] DOB: Jun 25, 1932  Julie Riley is a 86 y.o. year old female who is a primary care patient of [name], FNP. The CCM team was consulted for assistance with [name] .   Review of patient status, including review of consultants reports, other relevant assessments, and collaboration with appropriate care team members and the patient's provider was performed as part of comprehensive patient evaluation and provision of chronic care management services.    SDOH (Social Determinants of Health) assessments performed: No;risk for tobacco use; risk for depression; risk for stress; risk for physical inactivity    Clinical Support from 12/18/2019 in [facility]  PHQ-9 Total Score 2       Outpatient Encounter Medications as of 02/25/2020  Medication Sig   aspirin 325 MG EC tablet Take 325 mg by mouth daily.   atorvastatin (LIPITOR) 10 MG tablet Take 1 tablet (10 mg total) by mouth daily.   Cholecalciferol (VITAMIN D3) 50 MCG (2000 UT) capsule Take 1 capsule (2,000 Units total) by mouth daily.   diltiazem (CARDIZEM) 30 MG tablet Take 1 tablet (30 mg total) by mouth 2 (two) times daily.   donepezil (ARICEPT) 10 MG tablet Take 1 tablet (10 mg total) by mouth at bedtime.   enalapril (VASOTEC) 20 MG tablet Take 1 tablet (20 mg total) by mouth 2 (two) times daily.   gabapentin (NEURONTIN) 300 MG capsule Take 1 capsule (300 mg total) by mouth daily.   glucose blood test strip Use to check blood sugars two times daily   memantine (NAMENDA) 5 MG tablet TAKE 1 TABLET BY MOUTH TWICE DAILY   metFORMIN (GLUCOPHAGE) 500 MG tablet Take 1 tablet (500 mg total) by mouth daily with breakfast.   ReliOn Lancets Micro-Thin 33G MISC Use to check blood sugars two times daily   No facility-administered encounter medications on file as of 02/25/2020.    LCSW called  client home phone number today but was not able to talk via phone with client. LCSW left phone message for client to please return call to LCSW at [phone]. LCSW also called phone number for [name], daughter of client today, but LCSW was not able to speak via phone with Julie Riley today. LCSW did leave phone message for Julie Riley asking her to please return call to LCSW at [phone]   Follow Up Plan: LCSW to call client or daughter of client in next 4 weeks to talk with client or her daughter about CCM program support services  [name].Kevork Joyce MSW, LCSW Licensed Clinical Social Worker [facility] [phone]  I have reviewed the CCM documentation and agree with the written assessment and plan of care.  Jannifer Rodney, FNP

## 2020-03-16 ENCOUNTER — Ambulatory Visit: Payer: Medicare Other | Admitting: Family

## 2020-03-31 ENCOUNTER — Ambulatory Visit: Payer: Medicare Other | Admitting: Licensed Clinical Social Worker

## 2020-03-31 DIAGNOSIS — E559 Vitamin D deficiency, unspecified: Secondary | ICD-10-CM

## 2020-03-31 DIAGNOSIS — I10 Essential (primary) hypertension: Secondary | ICD-10-CM

## 2020-03-31 DIAGNOSIS — E119 Type 2 diabetes mellitus without complications: Secondary | ICD-10-CM

## 2020-03-31 DIAGNOSIS — Z8679 Personal history of other diseases of the circulatory system: Secondary | ICD-10-CM

## 2020-03-31 DIAGNOSIS — F0390 Unspecified dementia without behavioral disturbance: Secondary | ICD-10-CM

## 2020-03-31 NOTE — Patient Instructions (Addendum)
Licensed Clinical Social Worker Visit Information  Materials Provided: No  03/31/2020  Name: Julie Riley       MRN: [id number]       DOB: Feb 16, 1932  Julie Riley is a 86 y.o. year old female who is a primary care patient of [name], FNP. The CCM team was consulted for assistance with [name] .   Review of patient status, including review of consultants reports, other relevant assessments, and collaboration with appropriate care team members and the patient's provider was performed as part of comprehensive patient evaluation and provision of chronic care management services.    SDOH (Social Determinants of Health) assessments performed: No; risk for depression; risk for tobacco use; risk for stress; risk for physical inactivity  LCSW called client home number today but LCSW was not able to speak via phone today with client. Phone message for client was not set up and thus LCSW was not able to leave phone message for client. LCSW called phone number today for Julie Riley, daughter of client; however, LCSW was not able to speak with Julie Riley via phone today. LCSW did leave phone message for Julie Riley today requesting she return call to LCSW at [phone].  Follow Up Plan: LCSW to call client ordaughter of clientin next 4 weeks to talk with client orher daughterabout CCM program support services  LCSW was not able to speak to client or daughter, Julie Riley, via phone today; thus the client or daughter, Julie Riley, were not able to verbalize understanding of instructions provided today and were not able to accept or decline a print copy of patient instruction materials.   [name].Siomara Burkel MSW, LCSW Licensed Clinical Social Worker [facility] [phone]

## 2020-03-31 NOTE — Chronic Care Management (AMB) (Addendum)
°  Chronic Care Management    Clinical Social Work Follow Up Note  03/31/2020 Name: Julie Riley MRN: [id number] DOB: August 28, 1931  Julie Riley is a 86 y.o. year old female who is a primary care patient of Julie Spencer, FNP. The CCM team was consulted for assistance with [name] .   Review of patient status, including review of consultants reports, other relevant assessments, and collaboration with appropriate care team members and the patient's provider was performed as part of comprehensive patient evaluation and provision of chronic care management services.    SDOH (Social Determinants of Health) assessments performed: No; risk for depression; risk for tobacco use; risk for stress; risk for physical inactivity    Clinical Support from 12/18/2019 in [facility]  PHQ-9 Total Score 2         Outpatient Encounter Medications as of 03/31/2020  Medication Sig   aspirin 325 MG EC tablet Take 325 mg by mouth daily.   atorvastatin (LIPITOR) 10 MG tablet Take 1 tablet (10 mg total) by mouth daily.   Cholecalciferol (VITAMIN D3) 50 MCG (2000 UT) capsule Take 1 capsule (2,000 Units total) by mouth daily.   diltiazem (CARDIZEM) 30 MG tablet Take 1 tablet (30 mg total) by mouth 2 (two) times daily.   donepezil (ARICEPT) 10 MG tablet Take 1 tablet (10 mg total) by mouth at bedtime.   enalapril (VASOTEC) 20 MG tablet Take 1 tablet (20 mg total) by mouth 2 (two) times daily.   gabapentin (NEURONTIN) 300 MG capsule Take 1 capsule (300 mg total) by mouth daily.   glucose blood test strip Use to check blood sugars two times daily   memantine (NAMENDA) 5 MG tablet TAKE 1 TABLET BY MOUTH TWICE DAILY   metFORMIN (GLUCOPHAGE) 500 MG tablet Take 1 tablet (500 mg total) by mouth daily with breakfast.   ReliOn Lancets Micro-Thin 33G MISC Use to check blood sugars two times daily   No facility-administered encounter medications on file as of 03/31/2020.    LCSW called  client home number today but LCSW was not able to speak via phone today with client. Phone message for client was not set up and thus LCSW was not able to leave phone message for client. LCSW called phone number today for Julie Riley, daughter of client; however, LCSW was not able to speak with Julie Riley via phone today. LCSW did leave phone message for Julie Riley today requesting she return call to LCSW at [phone].  Follow Up Plan:  LCSW to call client or daughter of client in next 4 weeks to talk with client or her daughter about CCM program support services  [name].Frankye Schwegel MSW, LCSW Licensed Clinical Social Worker [facility] [phone]  I have reviewed the CCM documentation and agree with the written assessment and plan of care.  Jannifer Rodney, FNP

## 2020-04-07 ENCOUNTER — Ambulatory Visit (INDEPENDENT_AMBULATORY_CARE_PROVIDER_SITE_OTHER): Payer: Medicare Other | Admitting: Family

## 2020-04-07 ENCOUNTER — Encounter: Payer: Self-pay | Admitting: Family

## 2020-04-07 DIAGNOSIS — E119 Type 2 diabetes mellitus without complications: Secondary | ICD-10-CM

## 2020-04-07 DIAGNOSIS — N3281 Overactive bladder: Secondary | ICD-10-CM

## 2020-04-07 DIAGNOSIS — F0390 Unspecified dementia without behavioral disturbance: Secondary | ICD-10-CM

## 2020-04-07 DIAGNOSIS — I10 Essential (primary) hypertension: Secondary | ICD-10-CM | POA: Diagnosis not present

## 2020-04-07 DIAGNOSIS — E7800 Pure hypercholesterolemia, unspecified: Secondary | ICD-10-CM

## 2020-04-07 DIAGNOSIS — R413 Other amnesia: Secondary | ICD-10-CM

## 2020-04-07 NOTE — Progress Notes (Signed)
Virtual Visit via telephone Note Due to COVID-19 pandemic this visit was conducted virtually. This visit type was conducted due to national recommendations for restrictions regarding the COVID-19 Pandemic (e.g. social distancing, sheltering in place) in an effort to limit this patient's exposure and mitigate transmission in our community. All issues noted in this document were discussed and addressed.  A physical exam was not performed with this format.  I connected with Julie Riley on 04/07/20 at 12:47 pm by telephone and verified that I am speaking with the correct person using two identifiers. Julie Riley is currently located at home  and no one is currently with her during visit. The provider, Jannifer Rodney, FNP is located in their office at time of visit.  I discussed the limitations, risks, security and privacy concerns of performing an evaluation and management service by telephone and the availability of in person appointments. I also discussed with the patient that there may be a patient responsible charge related to this service. The patient expressed understanding and agreed to proceed.   History and Present Illness:  Pt calls the office today for chronic follow up. She reports she has not seen any difference of her memory since starting namenda and aricept.  Diabetes She presents for her follow-up diabetic visit. She has type 2 diabetes mellitus. Her disease course has been stable. There are no hypoglycemic associated symptoms. Associated symptoms include foot paresthesias. Pertinent negatives for diabetes include no blurred vision. Symptoms are stable. Pertinent negatives for diabetic complications include no CVA or heart disease. Risk factors for coronary artery disease include dyslipidemia, diabetes mellitus, hypertension and sedentary lifestyle. She is following a generally healthy diet. Her overall blood glucose range is 110-130 mg/dl. Eye exam is not current.    Hypertension This is a chronic problem. The current episode started more than 1 year ago. The problem has been resolved since onset. The problem is controlled. Pertinent negatives include no blurred vision, malaise/fatigue, peripheral edema or shortness of breath. Risk factors for coronary artery disease include diabetes mellitus and sedentary lifestyle. The current treatment provides moderate improvement. There is no history of CVA.  Urinary Frequency  This is a chronic problem. The current episode started more than 1 year ago. The problem occurs intermittently. The patient is experiencing no pain. Associated symptoms include frequency and urgency.  Hyperlipidemia This is a chronic problem. The current episode started more than 1 year ago. The problem is controlled. Recent lipid tests were reviewed and are normal. Exacerbating diseases include obesity. Pertinent negatives include no shortness of breath. Current antihyperlipidemic treatment includes statins. The current treatment provides moderate improvement of lipids. Risk factors for coronary artery disease include dyslipidemia, diabetes mellitus, hypertension, a sedentary lifestyle and post-menopausal.       Review of Systems  Constitutional: Negative for malaise/fatigue.  Eyes: Negative for blurred vision.  Respiratory: Negative for shortness of breath.   Genitourinary: Positive for frequency and urgency.  All other systems reviewed and are negative.    Observations/Objective: No SOB or distress noted, pt very pleasant   Assessment and Plan: 1. Essential hypertension  2. Type 2 diabetes mellitus without complication, without long-term current use of insulin (HCC)  3. Dementia without behavioral disturbance, unspecified dementia type (HCC)  4. OAB (overactive bladder)  5. Pure hypercholesterolemia  6. Memory changes  Labs reviewed from last visit, will get labs next time she is in office Continue medications  Encouraged  healthy diet and exercise  RTO in 4 months  I discussed the assessment and treatment plan with the patient. The patient was provided an opportunity to ask questions and all were answered. The patient agreed with the plan and demonstrated an understanding of the instructions.   The patient was advised to call back or seek an in-person evaluation if the symptoms worsen or if the condition fails to improve as anticipated.  The above assessment and management plan was discussed with the patient. The patient verbalized understanding of and has agreed to the management plan. Patient is aware to call the clinic if symptoms persist or worsen. Patient is aware when to return to the clinic for a follow-up visit. Patient educated on when it is appropriate to go to the emergency department.   Time call ended:  12:59 pm   I provided 12 minutes of non-face-to-face time during this encounter.    Jannifer Rodney, FNP

## 2020-04-11 ENCOUNTER — Ambulatory Visit: Payer: Medicare Other | Admitting: Family

## 2020-05-09 ENCOUNTER — Ambulatory Visit: Payer: Medicare Other | Admitting: Licensed Clinical Social Worker

## 2020-05-09 DIAGNOSIS — E559 Vitamin D deficiency, unspecified: Secondary | ICD-10-CM

## 2020-05-09 DIAGNOSIS — Z8679 Personal history of other diseases of the circulatory system: Secondary | ICD-10-CM

## 2020-05-09 DIAGNOSIS — F0390 Unspecified dementia without behavioral disturbance: Secondary | ICD-10-CM

## 2020-05-09 DIAGNOSIS — E119 Type 2 diabetes mellitus without complications: Secondary | ICD-10-CM

## 2020-05-09 DIAGNOSIS — I10 Essential (primary) hypertension: Secondary | ICD-10-CM

## 2020-05-09 NOTE — Chronic Care Management (AMB) (Addendum)
  Chronic Care Management    Clinical Social Work Follow Up Note  05/09/2020 Name: LILLIE PORTNER MRN: [id number] DOB: 1931-12-02  Elnita Maxwell Brys is a 86 y.o. year old female who is a primary care patient of [name], FNP. The CCM team was consulted for assistance with [name].   Review of patient status, including review of consultants reports, other relevant assessments, and collaboration with appropriate care team members and the patient's provider was performed as part of comprehensive patient evaluation and provision of chronic care management services.    SDOH (Social Determinants of Health) assessments performed: No;risk for depression; risk for tobacco use; risk for stress; risk for physical inactivity    Clinical Support from 12/18/2019 in [facility]  PHQ-9 Total Score 2         Outpatient Encounter Medications as of 05/09/2020  Medication Sig   aspirin 325 MG EC tablet Take 325 mg by mouth daily.   atorvastatin (LIPITOR) 10 MG tablet Take 1 tablet (10 mg total) by mouth daily.   Cholecalciferol (VITAMIN D3) 50 MCG (2000 UT) capsule Take 1 capsule (2,000 Units total) by mouth daily.   diltiazem (CARDIZEM) 30 MG tablet Take 1 tablet (30 mg total) by mouth 2 (two) times daily.   donepezil (ARICEPT) 10 MG tablet Take 1 tablet (10 mg total) by mouth at bedtime.   enalapril (VASOTEC) 20 MG tablet Take 1 tablet (20 mg total) by mouth 2 (two) times daily.   gabapentin (NEURONTIN) 300 MG capsule Take 1 capsule (300 mg total) by mouth daily.   glucose blood test strip Use to check blood sugars two times daily   memantine (NAMENDA) 5 MG tablet TAKE 1 TABLET BY MOUTH TWICE DAILY   metFORMIN (GLUCOPHAGE) 500 MG tablet Take 1 tablet (500 mg total) by mouth daily with breakfast.   ReliOn Lancets Micro-Thin 33G MISC Use to check blood sugars two times daily   No facility-administered encounter medications on file as of 05/09/2020.   LCSW called  client phone number today but was not able to speak with client. LCSW left phone message for client requesting she return call to client at [phone]. LCSW called phone number for [name], daughter of client, today but LCSW was not able to speak with [name] today via phone. LCSW left phone message for [name] requesting return call to LCSW at [phone]  Follow Up Plan:  LCSW to call client or daughter of client in next 4 weeks to talk with client or her daughter about CCM program support services   [name].[name] MSW, LCSW Licensed Clinical Social Worker [facility] [phone]  I have reviewed and agree with the above AWV documentation.   Jannifer Rodney, FNP

## 2020-05-09 NOTE — Patient Instructions (Addendum)
Licensed Clinical Social Worker Visit Information  Goals we discussed today:   05/09/2020  Name: Julie Riley       MRN: [id number]       DOB: Jan 22, 1932  Julie Riley is a 86 y.o. year old female who is a primary care patient of [name], FNP. The CCM team was consulted for assistance with [name].   Review of patient status, including review of consultants reports, other relevant assessments, and collaboration with appropriate care team members and the patient's provider was performed as part of comprehensive patient evaluation and provision of chronic care management services.    SDOH (Social Determinants of Health) assessments performed: No;risk for depression; risk for tobacco use; risk for stress; risk for physical inactivity  Materials Provided: No  LCSW called client phone number today but was not able to speak with client. LCSW left phone message for client requesting she return call to client at [phone]. LCSW called phone number for [name], daughter of client, today but LCSW was not able to speak with [name] today via phone. LCSW left phone message for [name] requesting return call to LCSW at [phone]  Follow Up Plan: LCSW to call client ordaughter of clientin next 4 weeks to talk with client orher daughterabout CCM program support services  LCSW was not able to speak via phone with client or her daughter today; thus, the patient or her daughter were not able to verbalize understanding of instructions provided today and were not able to accept or decline a print copy of patient instruction materials.   [name].Megen Madewell MSW, LCSW Licensed Clinical Social Worker [facility] [phone]

## 2020-06-14 ENCOUNTER — Telehealth: Payer: Medicare Other

## 2020-06-16 ENCOUNTER — Telehealth: Payer: Medicare Other

## 2020-07-21 ENCOUNTER — Telehealth: Payer: Medicare Other

## 2020-07-25 ENCOUNTER — Other Ambulatory Visit: Payer: Self-pay | Admitting: Family Medicine

## 2020-07-25 DIAGNOSIS — R413 Other amnesia: Secondary | ICD-10-CM

## 2020-07-26 ENCOUNTER — Ambulatory Visit (INDEPENDENT_AMBULATORY_CARE_PROVIDER_SITE_OTHER): Payer: Medicare Other | Admitting: Licensed Clinical Social Worker

## 2020-07-26 DIAGNOSIS — F0390 Unspecified dementia without behavioral disturbance: Secondary | ICD-10-CM | POA: Diagnosis not present

## 2020-07-26 DIAGNOSIS — I10 Essential (primary) hypertension: Secondary | ICD-10-CM

## 2020-07-26 DIAGNOSIS — Z8679 Personal history of other diseases of the circulatory system: Secondary | ICD-10-CM

## 2020-07-26 DIAGNOSIS — E119 Type 2 diabetes mellitus without complications: Secondary | ICD-10-CM

## 2020-07-26 DIAGNOSIS — E559 Vitamin D deficiency, unspecified: Secondary | ICD-10-CM

## 2020-07-26 NOTE — Chronic Care Management (AMB) (Signed)
Chronic Care Management    Clinical Social Work Follow Up Note  07/26/2020 Name: LEONILA SPERANZA MRN: [id number] DOB: 1932-04-25  Elnita Maxwell Dougal is a 87 y.o. year old female who is a primary care patient of [name], FNP. The CCM team was consulted for assistance with [name] .   Review of patient status, including review of consultants reports, other relevant assessments, and collaboration with appropriate care team members and the patient's provider was performed as part of comprehensive patient evaluation and provision of chronic care management services.    SDOH (Social Determinants of Health) assessments performed: No; risk for depression; risk for tobacco use; risk for stress; risk for physical inactivity  Flowsheet Row Clinical Support from 12/18/2019 in [facility]  PHQ-9 Total Score 2      Outpatient Encounter Medications as of 07/26/2020  Medication Sig  . aspirin 325 MG EC tablet Take 325 mg by mouth daily.  Marland Kitchen atorvastatin (LIPITOR) 10 MG tablet Take 1 tablet (10 mg total) by mouth daily.  . Cholecalciferol (VITAMIN D3) 50 MCG (2000 UT) capsule TAKE 1 TABLET BY MOUTH EVERY DAY  . diltiazem (CARDIZEM) 30 MG tablet Take 1 tablet (30 mg total) by mouth 2 (two) times daily.  Marland Kitchen donepezil (ARICEPT) 10 MG tablet Take 1 tablet (10 mg total) by mouth at bedtime.  . enalapril (VASOTEC) 20 MG tablet Take 1 tablet (20 mg total) by mouth 2 (two) times daily.  Marland Kitchen gabapentin (NEURONTIN) 300 MG capsule Take 1 capsule (300 mg total) by mouth daily.  Marland Kitchen glucose blood test strip Use to check blood sugars two times daily  . memantine (NAMENDA) 5 MG tablet TAKE 1 TABLET BY MOUTH TWICE DAILY  . metFORMIN (GLUCOPHAGE) 500 MG tablet Take 1 tablet (500 mg total) by mouth daily with breakfast.  . ReliOn Lancets Micro-Thin 33G MISC Use to check blood sugars two times daily   No facility-administered encounter medications on file as of 07/26/2020.    Goals    .    Acknowledge receipt of Advanced Directive package    .  [name] Odle will talk with LCSW in next 30 days to discuss memory challenges of client and to discuss client completion of daily activities (pt-stated)      CARE PLAN ENTRY   Current Barriers:  Marland Kitchen Memory issues in client with chronic diagnoses of HTN, Atrial Fibrillation, Type 2 DM, Vitamin D deficiency, Dementia  Clinical Social Work Clinical Goal(s):  Marland Kitchen LCSW to call client or [employer] in next 4 weeks to discuss memory challenges of client and to discuss client completion of daily activities  Interventions: . Talked with [name] daughter of client about client needs . Talked [name] about appetite of client . Talked with [name] about memory challenges of client . Talked with [name] about daily care needs of client . Talked with [name] about sleeping issues of client . Talked with [name] about medication administration of client . Talked with [name] about vision of client . Talked with [name] about upcoming client medical appointments . Talked with client about ambulation of client (uses a cane to help her walk) . Talked with [name] about hearing needs of client [name] thinks that client has had a decrease in hearing) . Talked with [name] about client fall 2 years ago (client hit her head on left side: [name] said client has had decreased hearing on left side since that fall) . Talked with [name] about communication of client . Talked  with [name] about fact that client is unsteady when client walks   Patient Self Care Activities:  Eats meals with set up assistance  Patient Self Care Deficits:   Marland Kitchen Memory issues  Initial goal documentation     Follow Up Plan: LCSW to call client ordaughter of clientin next 4 weeks to talk with client orher daughterabout CCM program support services  [name].Charlyn Vialpando MSW, LCSW Licensed Clinical Social Worker [facility] [phone]

## 2020-07-26 NOTE — Patient Instructions (Addendum)
Licensed Clinical Social Worker Visit Information  Goals we discussed today:  .  [name] Jaworski will talk with LCSW in next 30 days to discuss memory challenges of client and to discuss client completion of daily activities (pt-stated)        CARE PLAN ENTRY   Current Barriers:   Memory issues in client with chronic diagnoses of HTN, Atrial Fibrillation, Type 2 DM, Vitamin D deficiency, Dementia  Clinical Social Work Clinical Goal(s):   LCSW to call client or Carmesha Morocco in next 4 weeks to discuss memory challenges of client and to discuss client completion of daily activities  Interventions:  Talked with [name] daughter of client about client needs  Talked [name] about appetite of client  Talked with [name] about memory challenges of client  Talked with [name] about daily care needs of client  Talked with [name] about sleeping issues of client  Talked with [name] about medication administration of client  Talked with [name] about vision of client  Talked with [name] about upcoming client medical appointments  Talked with client about ambulation of client (uses a cane to help her walk)  Talked with [name] about hearing needs of client [name] thinks that client has had a decrease in hearing)  Talked with [name] about client fall 2 years ago (client hit her head on left side: [name] said client has had decreased hearing on left side since that fall)  Talked with [name] about communication of client  Talked with [name] about fact that client is unsteady when client walks   Patient Self Care Activities:  Eats meals with set up assistance  Patient Self Care Deficits:    Memory issues  Initial goal documentation     Follow Up Plan:LCSW to call client ordaughter of clientin next 4 weeks to talk with client orher daughterabout CCM program support services  Materials Provided: No  The patient Antonina Deziel, daughter of patient, verbalized understanding of  instructions provided today and declined a print copy of patient instruction materials.   [name].Yoana Staib MSW, LCSW Licensed Clinical Social Worker [facility] Care Management [phone]

## 2020-08-26 ENCOUNTER — Telehealth: Payer: Medicare Other

## 2020-09-20 ENCOUNTER — Ambulatory Visit (INDEPENDENT_AMBULATORY_CARE_PROVIDER_SITE_OTHER): Payer: Medicare Other | Admitting: Licensed Clinical Social Worker

## 2020-09-20 DIAGNOSIS — E119 Type 2 diabetes mellitus without complications: Secondary | ICD-10-CM

## 2020-09-20 DIAGNOSIS — F0390 Unspecified dementia without behavioral disturbance: Secondary | ICD-10-CM

## 2020-09-20 DIAGNOSIS — Z8679 Personal history of other diseases of the circulatory system: Secondary | ICD-10-CM

## 2020-09-20 DIAGNOSIS — I10 Essential (primary) hypertension: Secondary | ICD-10-CM

## 2020-09-20 DIAGNOSIS — E559 Vitamin D deficiency, unspecified: Secondary | ICD-10-CM

## 2020-09-20 NOTE — Chronic Care Management (AMB) (Signed)
Chronic Care Management    Clinical Social Work Note  09/20/2020 Name: Julie Riley MRN: [id number] DOB: 1932-03-13  Julie Riley is a 87 y.o. year old female who is a primary care patient of [name], FNP. The CCM team was consulted to assist the patient with chronic disease management and/or care coordination needs related to: [name] .   Did not engage with patient or daugther by telephone today for follow up visit in response to provider referral for social work chronic care management and care coordination services. LCSW did leave phone message for Julie Riley, daughter of patient, requesting she return call to LCSW at [phone]  Consent to Services:  The patient was given information about Chronic Care Management services, agreed to services, and gave verbal consent prior to initiation of services.  Please see initial visit note for detailed documentation.   Patient agreed to services and consent obtained.   Assessment: Review of patient past medical history, allergies, medications, and health status, including review of relevant consultants reports was performed today as part of a comprehensive evaluation and provision of chronic care management and care coordination services.     SDOH (Social Determinants of Health) assessments and interventions performed:    Advanced Directives Status: See Vynca application for related entries.  CCM Care Plan  Allergies  Allergen Reactions  . Amoxicillin   . Penicillins   . Xarelto [Rivaroxaban] Other (See Comments)    Severe diarrhea    Outpatient Encounter Medications as of 09/20/2020  Medication Sig  . aspirin 325 MG EC tablet Take 325 mg by mouth daily.  Marland Kitchen atorvastatin (LIPITOR) 10 MG tablet Take 1 tablet (10 mg total) by mouth daily.  . Cholecalciferol (VITAMIN D3) 50 MCG (2000 UT) capsule TAKE 1 TABLET BY MOUTH EVERY DAY  . diltiazem (CARDIZEM) 30 MG tablet Take 1 tablet (30 mg total) by mouth 2  (two) times daily.  Marland Kitchen donepezil (ARICEPT) 10 MG tablet Take 1 tablet (10 mg total) by mouth at bedtime.  . enalapril (VASOTEC) 20 MG tablet Take 1 tablet (20 mg total) by mouth 2 (two) times daily.  Marland Kitchen gabapentin (NEURONTIN) 300 MG capsule Take 1 capsule (300 mg total) by mouth daily.  Marland Kitchen glucose blood test strip Use to check blood sugars two times daily  . memantine (NAMENDA) 5 MG tablet TAKE 1 TABLET BY MOUTH TWICE DAILY  . metFORMIN (GLUCOPHAGE) 500 MG tablet Take 1 tablet (500 mg total) by mouth daily with breakfast.  . ReliOn Lancets Micro-Thin 33G MISC Use to check blood sugars two times daily   No facility-administered encounter medications on file as of 09/20/2020.    Patient Active Problem List   Diagnosis Date Noted  . Dementia (HCC) 11/16/2019  . Memory changes 11/02/2019  . Essential hypertension 07/18/2016  . History of atrial fibrillation 07/18/2016  . Type 2 diabetes mellitus without complication, without long-term current use of insulin (HCC) 07/18/2016  . Neuropathy 07/18/2016  . Vitamin D deficiency 07/18/2016  . OAB (overactive bladder) 07/18/2016  . Pure hypercholesterolemia 07/18/2016  . Carotid stenosis     Conditions to be addressed/monitored: Monitor memory issues of client;   Care Plan : LCSW care plan  Updates made by Isaiah Blakes, LCSW since 09/20/2020 12:00 AM    Problem: Cognitive Function     Goal: Manage memory issues for client   Start Date: 09/20/2020  Expected End Date: 12/21/2020  This Visit's Progress: On track  Priority: High  Note:  Current Barriers:  Marland Kitchen Memory issues; cognitive decline . Mobility issues . Difficulty completing ADLs . Suicidal Ideation/Homicidal Ideation: No  Clinical Social Work Goal(s):  . patient Julie Riley will work with SW in next 30 days by telephone or in person to reduce or manage symptoms related to client cognitive issues . Client to communicate regularly in next 30 days with [name], daughter, about  needs of client . Client to attend scheduled client medical appointments in next 30 days  Interventions: . 1:1 collaboration with [name], FNP regarding development and update of comprehensive plan of care as evidenced by provider attestation and co-signature . Chart review completed to assess client needs . Called [name], daughter, today and left phone message for Julie Riley to please call LCSW at [phone]  Patient Self Care Activities:  . Attends all scheduled provider appointments  Patient Coping Strengths:  . Family . Hopefulness  Patient Self Care Deficits:  Marland Kitchen Mobility issues . Memory challenges  Patient Goals:  - spend time or talk with others every day - practice relaxation or meditation daily - keep a calendar with appointment dates  Follow Up Plan: LCSW to call client or daughter of client on 10/25/20     [name].Meia Emley MSW, LCSW Licensed Clinical Social Worker [facility] Care Management [phone]

## 2020-09-20 NOTE — Patient Instructions (Signed)
Visit Information  PATIENT GOALS: Goals Addressed              This Visit's Progress   .  Manage memory issues for client (pt-stated)        Timeframe:  Short-Term Goal Priority:  Medium Progress:  On Track Start Date:              09/20/20               Expected End Date:          12/21/20             Follow Up Date  10/25/20    Protect My Health (Patient) Manage memory issues of client     Why is this important?    Screening tests can find diseases early when they are easier to treat.   Your doctor or nurse will talk with you about which tests are important for you.   Getting shots for common diseases like the flu and shingles will help prevent them.    Patient Self Care Activities:  . Attends all scheduled provider appointments  Patient Coping Strengths:  . Family . Hopefulness  Patient Self Care Deficits:  Marland Kitchen Mobility issues . Memory challenges  Patient Goals:  - spend time or talk with others every day - practice relaxation or meditation daily - keep a calendar with appointment dates  Follow Up Plan: LCSW to call client  or daughter of client on 10/25/20       [name].Noel Rodier MSW, LCSW Licensed Clinical Social Worker [facility] Care Management [phone]

## 2020-10-04 ENCOUNTER — Encounter: Payer: Self-pay | Admitting: Family

## 2020-10-04 ENCOUNTER — Other Ambulatory Visit: Payer: Self-pay

## 2020-10-04 ENCOUNTER — Ambulatory Visit (INDEPENDENT_AMBULATORY_CARE_PROVIDER_SITE_OTHER): Payer: Medicare Other | Admitting: Family

## 2020-10-04 VITALS — BP 137/75 | HR 57 | Temp 97.9°F | Ht 61.0 in | Wt 120.6 lb

## 2020-10-04 DIAGNOSIS — F0390 Unspecified dementia without behavioral disturbance: Secondary | ICD-10-CM

## 2020-10-04 DIAGNOSIS — N3281 Overactive bladder: Secondary | ICD-10-CM

## 2020-10-04 DIAGNOSIS — E559 Vitamin D deficiency, unspecified: Secondary | ICD-10-CM

## 2020-10-04 DIAGNOSIS — Z8679 Personal history of other diseases of the circulatory system: Secondary | ICD-10-CM | POA: Diagnosis not present

## 2020-10-04 DIAGNOSIS — I10 Essential (primary) hypertension: Secondary | ICD-10-CM

## 2020-10-04 DIAGNOSIS — R413 Other amnesia: Secondary | ICD-10-CM

## 2020-10-04 DIAGNOSIS — E7800 Pure hypercholesterolemia, unspecified: Secondary | ICD-10-CM

## 2020-10-04 DIAGNOSIS — E119 Type 2 diabetes mellitus without complications: Secondary | ICD-10-CM

## 2020-10-04 LAB — BAYER DCA HB A1C WAIVED: HB A1C (BAYER DCA - WAIVED): 6.5 % (ref <7.0)

## 2020-10-04 NOTE — Patient Instructions (Signed)

## 2020-10-04 NOTE — Progress Notes (Signed)
Subjective:    Patient ID: Julie Riley, female    DOB: 1931/08/08, 87 y.o.   MRN: [id number]  Chief Complaint  Patient presents with  . Hypertension    6 month follow up   Pt calls the office today for chronic follow up. She reports she forgot about several of her medications and never restarted.   Daughter states she did not see any difference in her memory with namenda and aricept. She does report she only took it for a few doses.  Hypertension This is a chronic problem. The current episode started more than 1 year ago. The problem has been resolved since onset. The problem is controlled. Associated symptoms include peripheral edema ("slight"). Pertinent negatives include no malaise/fatigue or shortness of breath. Risk factors for coronary artery disease include dyslipidemia. Treatments tried: per patient she stopped her medications, but her BP is at goal. The current treatment provides mild improvement. There is no history of kidney disease.  Hyperlipidemia This is a chronic problem. The current episode started more than 1 year ago. Pertinent negatives include no shortness of breath. Current antihyperlipidemic treatment includes diet change. The current treatment provides mild improvement of lipids.  Urinary Frequency  This is a chronic problem. The current episode started more than 1 year ago. The problem has been waxing and waning. The pain is at a severity of 2/10. The patient is experiencing no pain. Associated symptoms include frequency. Pertinent negatives include no hematuria or urgency. She has tried increased fluids for the symptoms. The treatment provided mild relief.      Review of Systems  Constitutional: Negative for malaise/fatigue.  Respiratory: Negative for shortness of breath.   Genitourinary: Positive for frequency. Negative for hematuria and urgency.  All other systems reviewed and are negative.      Objective:   Physical Exam Vitals reviewed.   Constitutional:      General: She is not in acute distress.    Appearance: She is well-developed.  HENT:     Head: Normocephalic and atraumatic.     Right Ear: Tympanic membrane normal.     Left Ear: Tympanic membrane normal.  Eyes:     Pupils: Pupils are equal, round, and reactive to light.  Neck:     Thyroid: No thyromegaly.  Cardiovascular:     Rate and Rhythm: Normal rate and regular rhythm.     Heart sounds: Normal heart sounds. No murmur heard.   Pulmonary:     Effort: Pulmonary effort is normal. No respiratory distress.     Breath sounds: Decreased breath sounds present. No wheezing.  Abdominal:     General: Bowel sounds are normal. There is no distension.     Palpations: Abdomen is soft.     Tenderness: There is no abdominal tenderness.  Musculoskeletal:        General: No tenderness. Normal range of motion.     Cervical back: Normal range of motion and neck supple.  Skin:    General: Skin is warm and dry.  Neurological:     Mental Status: She is alert and oriented to person, place, and time.     Cranial Nerves: No cranial nerve deficit.     Deep Tendon Reflexes: Reflexes are normal and symmetric.  Psychiatric:        Behavior: Behavior normal.        Thought Content: Thought content normal.        Cognition and Memory: Cognition is impaired. Memory is impaired. She exhibits  impaired recent memory.        Judgment: Judgment normal.       BP 137/75   Pulse (!) 57   Temp 97.9 F (36.6 C) (Temporal)   Ht 5' 1" (1.549 m)   Wt 120 lb 9.6 oz (54.7 kg)   BMI 22.79 kg/m      Assessment & Plan:  Julie Riley comes in today with chief complaint of Hypertension (6 month follow up)   Diagnosis and orders addressed:  1. Essential hypertension - CMP14+EGFR - CBC with Differential/Platelet  2. Type 2 diabetes mellitus without complication, without long-term current use of insulin (HCC) - Bayer DCA Hb A1c Waived - CMP14+EGFR - CBC with  Differential/Platelet - Microalbumin / creatinine urine ratio  3. Dementia without behavioral disturbance, unspecified dementia type (HCC) - CMP14+EGFR - CBC with Differential/Platelet  4. OAB (overactive bladder) - CMP14+EGFR - CBC with Differential/Platelet  5. History of atrial fibrillation - CMP14+EGFR - CBC with Differential/Platelet  6. Vitamin D deficiency - CMP14+EGFR - CBC with Differential/Platelet  7. Pure hypercholesterolemia - CMP14+EGFR - CBC with Differential/Platelet  8. Memory changes - CMP14+EGFR - CBC with Differential/Platelet   Labs pending Health Maintenance reviewed Diet and exercise encouraged  Follow up plan: 4 months    Evelina Dun, FNP

## 2020-10-05 LAB — CMP14+EGFR
ALT: 12 IU/L (ref 0–32)
AST: 16 IU/L (ref 0–40)
Albumin/Globulin Ratio: 1.9 (ref 1.2–2.2)
Albumin: 4.2 g/dL (ref 3.6–4.6)
Alkaline Phosphatase: 97 IU/L (ref 44–121)
BUN/Creatinine Ratio: 15 (ref 12–28)
BUN: 12 mg/dL (ref 8–27)
Bilirubin Total: 0.6 mg/dL (ref 0.0–1.2)
CO2: 25 mmol/L (ref 20–29)
Calcium: 9.3 mg/dL (ref 8.7–10.3)
Chloride: 103 mmol/L (ref 96–106)
Creatinine, Ser: 0.8 mg/dL (ref 0.57–1.00)
Globulin, Total: 2.2 g/dL (ref 1.5–4.5)
Glucose: 110 mg/dL — ABNORMAL HIGH (ref 65–99)
Potassium: 4.3 mmol/L (ref 3.5–5.2)
Sodium: 142 mmol/L (ref 134–144)
Total Protein: 6.4 g/dL (ref 6.0–8.5)
eGFR: 71 mL/min/{1.73_m2} (ref >59)

## 2020-10-05 LAB — CBC WITH DIFFERENTIAL/PLATELET
Basophils Absolute: 0.1 10*3/uL (ref 0.0–0.2)
Basos: 1 %
EOS (ABSOLUTE): 0.2 10*3/uL (ref 0.0–0.4)
Eos: 3 %
Hematocrit: 39.2 % (ref 34.0–46.6)
Hemoglobin: 13.1 g/dL (ref 11.1–15.9)
Immature Grans (Abs): 0 10*3/uL (ref 0.0–0.1)
Immature Granulocytes: 0 %
Lymphocytes Absolute: 1.2 10*3/uL (ref 0.7–3.1)
Lymphs: 22 %
MCH: 30.1 pg (ref 26.6–33.0)
MCHC: 33.4 g/dL (ref 31.5–35.7)
MCV: 90 fL (ref 79–97)
Monocytes Absolute: 0.6 10*3/uL (ref 0.1–0.9)
Monocytes: 10 %
Neutrophils Absolute: 3.6 10*3/uL (ref 1.4–7.0)
Neutrophils: 64 %
Platelets: 201 10*3/uL (ref 150–450)
RBC: 4.35 x10E6/uL (ref 3.77–5.28)
RDW: 12.3 % (ref 11.7–15.4)
WBC: 5.7 10*3/uL (ref 3.4–10.8)

## 2020-10-05 LAB — MICROALBUMIN / CREATININE URINE RATIO
Creatinine, Urine: 35 mg/dL
Microalb/Creat Ratio: 55 mg/g creat — ABNORMAL HIGH (ref 0–29)
Microalbumin, Urine: 19.2 ug/mL

## 2020-10-10 ENCOUNTER — Other Ambulatory Visit: Payer: Self-pay | Admitting: Family

## 2020-10-11 ENCOUNTER — Encounter: Payer: Self-pay | Admitting: Family Medicine

## 2020-10-12 ENCOUNTER — Ambulatory Visit (INDEPENDENT_AMBULATORY_CARE_PROVIDER_SITE_OTHER): Payer: Medicare Other | Admitting: Licensed Clinical Social Worker

## 2020-10-12 DIAGNOSIS — E559 Vitamin D deficiency, unspecified: Secondary | ICD-10-CM

## 2020-10-12 DIAGNOSIS — F0390 Unspecified dementia without behavioral disturbance: Secondary | ICD-10-CM

## 2020-10-12 DIAGNOSIS — I10 Essential (primary) hypertension: Secondary | ICD-10-CM

## 2020-10-12 DIAGNOSIS — E119 Type 2 diabetes mellitus without complications: Secondary | ICD-10-CM

## 2020-10-12 DIAGNOSIS — Z8679 Personal history of other diseases of the circulatory system: Secondary | ICD-10-CM

## 2020-10-12 NOTE — Patient Instructions (Addendum)
Visit Information  PATIENT GOALS:  Timeframe:  Short-Term Goal Priority:  Medium Progress:  On Track Start Date:              09/20/20               Expected End Date:          12/21/20             Follow Up Date  10/25/20    Protect My Health (Patient) Manage memory issues of client     Why is this important?    Screening tests can find diseases early when they are easier to treat.   Your doctor or nurse will talk with you about which tests are important for you.   Getting shots for common diseases like the flu and shingles will help prevent them.    Patient Self Care Activities:  . Attends all scheduled provider appointments  Patient Coping Strengths:  . Family . Hopefulness  Patient Self Care Deficits:  Marland Kitchen Mobility issues . Memory challenges  Patient Goals:  - spend time or talk with others every day - practice relaxation or meditation daily - keep a calendar with appointment dates  Follow Up Plan: LCSW to call client  or daughter of client on 10/25/20   [name].Ranulfo Kall MSW, LCSW Licensed Clinical Social Worker [facility] Care Management [phone]

## 2020-10-12 NOTE — Chronic Care Management (AMB) (Signed)
Chronic Care Management    Clinical Social Work Note  10/12/2020 Name: Julie Riley MRN: [id number] DOB: [birth year]  Julie Riley is a 87 y.o. year old female who is a primary care patient of Julie Spencer, FNP. The CCM team was consulted to assist the patient with chronic disease management and/or care coordination needs related to: [name] .   Engaged with patient Julie Riley, daughter of patient, by telephone for follow up visit in response to provider referral for social work chronic care management and care coordination services.   Consent to Services:  The patient was given information about Chronic Care Management services, agreed to services, and gave verbal consent prior to initiation of services.  Please see initial visit note for detailed documentation.   Patient agreed to services and consent obtained.   Assessment: Review of patient past medical history, allergies, medications, and health status, including review of relevant consultants reports was performed today as part of a comprehensive evaluation and provision of chronic care management and care coordination services.     SDOH (Social Determinants of Health) assessments and interventions performed:  SDOH Interventions   Flowsheet Row Most Recent Value  SDOH Interventions   Depression Interventions/Treatment  --  [informed client and daughter of client about LCSW support and about RNCM support]       Advanced Directives Status: See Vynca application for related entries.  CCM Care Plan  Allergies  Allergen Reactions  . Amoxicillin   . Penicillins   . Xarelto [Rivaroxaban] Other (See Comments)    Severe diarrhea    Outpatient Encounter Medications as of 10/12/2020  Medication Sig  . aspirin 325 MG EC tablet Take 325 mg by mouth daily.  Marland Kitchen atorvastatin (LIPITOR) 10 MG tablet Take 1 tablet (10 mg total) by mouth daily. (Patient not taking: Reported on 10/04/2020)  . Cholecalciferol (VITAMIN  D3) 50 MCG (2000 UT) capsule TAKE 1 TABLET BY MOUTH EVERY DAY  . diltiazem (CARDIZEM) 30 MG tablet Take 1 tablet (30 mg total) by mouth 2 (two) times daily. (Patient not taking: Reported on 10/04/2020)  . enalapril (VASOTEC) 20 MG tablet Take 1 tablet (20 mg total) by mouth 2 (two) times daily. (Patient not taking: Reported on 10/04/2020)  . glucose blood test strip Use to check blood sugars two times daily  . ReliOn Lancets Micro-Thin 33G MISC Use to check blood sugars two times daily   No facility-administered encounter medications on file as of 10/12/2020.    Patient Active Problem List   Diagnosis Date Noted  . Dementia (HCC) 11/16/2019  . Memory changes 11/02/2019  . Essential hypertension 07/18/2016  . History of atrial fibrillation 07/18/2016  . Type 2 diabetes mellitus without complication, without long-term current use of insulin (HCC) 07/18/2016  . Neuropathy 07/18/2016  . Vitamin D deficiency 07/18/2016  . OAB (overactive bladder) 07/18/2016  . Pure hypercholesterolemia 07/18/2016  . Carotid stenosis     Conditions to be addressed/monitored: Monitor memory challenges of client   Care Plan : LCSW care plan  Updates made by Julie Blakes, LCSW since 10/12/2020 12:00 AM    Problem: Cognitive Function     Goal: Manage memory issues for client   Start Date: 09/20/2020  Expected End Date: 12/21/2020  This Visit's Progress: On track  Recent Progress: On track  Priority: High  Note:   Current Barriers:  Marland Kitchen Memory issues; cognitive decline . Mobility issues . Difficulty completing ADLs . Suicidal Ideation/Homicidal Ideation: No  Clinical Social  Work Goal(s):  . patient Julie Riley will work with SW in next 30 days by telephone or in person to reduce or manage symptoms related to client cognitive issues . Client to communicate regularly in next 30 days with Julie Riley, daughter, about needs of client . Client to attend scheduled client medical appointments in next 30  days  Interventions: . 1:1 collaboration with Julie Spencer, FNP regarding development and update of comprehensive plan of care as evidenced by provider attestation and co-signature . Chart review completed to assess client needs . Talked with Julie Riley, daughter of client about client needs . Talked with Julie Riley about memory challenges of client . Talked with Julie Riley about level of care issues of client . Talked with Julie Riley about [facility] in [place], [place] . Encouraged Julie Riley to tour that ALF  . Encouraged Julie Riley to call [facility] as needed for nursing support for client  Patient Self Care Activities:  . Attends all scheduled provider appointments  Patient Coping Strengths:  . Family . Hopefulness  Patient Self Care Deficits:  Marland Kitchen Mobility issues . Memory challenges  Patient Goals:  - spend time or talk with others every day - practice relaxation or meditation daily - keep a calendar with appointment dates  Follow Up Plan: LCSW to call client or daughter of client on 10/25/20     Julie Riley.Julie Riley MSW, LCSW Licensed Clinical Social Worker [facility] Care Management [phone]

## 2020-10-25 ENCOUNTER — Ambulatory Visit (INDEPENDENT_AMBULATORY_CARE_PROVIDER_SITE_OTHER): Payer: Medicare Other | Admitting: Licensed Clinical Social Worker

## 2020-10-25 DIAGNOSIS — E119 Type 2 diabetes mellitus without complications: Secondary | ICD-10-CM

## 2020-10-25 DIAGNOSIS — I10 Essential (primary) hypertension: Secondary | ICD-10-CM

## 2020-10-25 DIAGNOSIS — E559 Vitamin D deficiency, unspecified: Secondary | ICD-10-CM

## 2020-10-25 DIAGNOSIS — Z8679 Personal history of other diseases of the circulatory system: Secondary | ICD-10-CM

## 2020-10-25 DIAGNOSIS — F0390 Unspecified dementia without behavioral disturbance: Secondary | ICD-10-CM

## 2020-10-25 NOTE — Patient Instructions (Signed)
Visit Information  PATIENT GOALS: Goals Addressed              This Visit's Progress   .  Manage memory issues for client (pt-stated)        Timeframe:  Short-Term Goal Priority:  High Progress:  On Track Start Date:              09/20/20               Expected End Date:          12/21/20             Follow Up Date  12/02/20    Protect My Health (Patient) Manage memory issues of client     Why is this important?    Screening tests can find diseases early when they are easier to treat.   Your doctor or nurse will talk with you about which tests are important for you.   Getting shots for common diseases like the flu and shingles will help prevent them.    Patient Self Care Activities:  . Attends all scheduled provider appointments  Patient Coping Strengths:  . Family . Hopefulness  Patient Self Care Deficits:  Marland Kitchen Mobility issues . Memory challenges  Patient Goals:  - spend time or talk with others every day - practice relaxation or meditation daily - keep a calendar with appointment dates  Follow Up Plan: LCSW to call client  or daughter of client on 12/02/20       [name].Tram Wrenn MSW, LCSW Licensed Clinical Social Worker [facility] Care Management [phone]

## 2020-10-25 NOTE — Chronic Care Management (AMB) (Signed)
Chronic Care Management    Clinical Social Work Note  10/25/2020 Name: GALILEE PIERRON MRN: [id number] DOB: [birth year]  Elnita Maxwell Honea is a 87 y.o. year old female who is a primary care patient of [name], FNP. The CCM team was consulted to assist the patient with chronic disease management and/or care coordination needs related to: [name] .   LCSW called [name], daughter of client, several times today but LCSW was not able to engage with patient or her daughter by telephone for follow up visit in response to provider referral for social work chronic care management and care coordination services.   Consent to Services:  The patient was given information about Chronic Care Management services, agreed to services, and gave verbal consent prior to initiation of services.  Please see initial visit note for detailed documentation.   Patient agreed to services and consent obtained.   Assessment: Review of patient past medical history, allergies, medications, and health status, including review of relevant consultants reports was performed today as part of a comprehensive evaluation and provision of chronic care management and care coordination services.     SDOH (Social Determinants of Health) assessments and interventions performed:  SDOH Interventions   Flowsheet Row Most Recent Value  SDOH Interventions   Depression Interventions/Treatment  --  [informed client/daughter of LCSW support and of RNCM support]       Advanced Directives Status: See Vynca application for related entries.  CCM Care Plan  Allergies  Allergen Reactions  . Amoxicillin   . Penicillins   . Xarelto [Rivaroxaban] Other (See Comments)    Severe diarrhea    Outpatient Encounter Medications as of 10/25/2020  Medication Sig  . aspirin 325 MG EC tablet Take 325 mg by mouth daily.  Marland Kitchen atorvastatin (LIPITOR) 10 MG tablet Take 1 tablet (10 mg total) by mouth daily. (Patient not taking:  Reported on 10/04/2020)  . Cholecalciferol (VITAMIN D3) 50 MCG (2000 UT) capsule TAKE 1 TABLET BY MOUTH EVERY DAY  . diltiazem (CARDIZEM) 30 MG tablet Take 1 tablet (30 mg total) by mouth 2 (two) times daily. (Patient not taking: Reported on 10/04/2020)  . enalapril (VASOTEC) 20 MG tablet Take 1 tablet (20 mg total) by mouth 2 (two) times daily. (Patient not taking: Reported on 10/04/2020)  . glucose blood test strip Use to check blood sugars two times daily  . ReliOn Lancets Micro-Thin 33G MISC Use to check blood sugars two times daily   No facility-administered encounter medications on file as of 10/25/2020.    Patient Active Problem List   Diagnosis Date Noted  . Dementia (HCC) 11/16/2019  . Memory changes 11/02/2019  . Essential hypertension 07/18/2016  . History of atrial fibrillation 07/18/2016  . Type 2 diabetes mellitus without complication, without long-term current use of insulin (HCC) 07/18/2016  . Neuropathy 07/18/2016  . Vitamin D deficiency 07/18/2016  . OAB (overactive bladder) 07/18/2016  . Pure hypercholesterolemia 07/18/2016  . Carotid stenosis     Conditions to be addressed/monitored: Monitor client completion of daily ADLs   Care Plan : LCSW care plan  Updates made by Isaiah Blakes, LCSW since 10/25/2020 12:00 AM    Problem: Cognitive Function     Goal: Manage memory issues for client   Start Date: 09/20/2020  Expected End Date: 12/21/2020  This Visit's Progress: On track  Recent Progress: On track  Priority: High  Note:   Current Barriers:  Marland Kitchen Memory issues; cognitive decline . Mobility issues .  Difficulty completing ADLs . Suicidal Ideation/Homicidal Ideation: No  Clinical Social Work Goal(s):  . patient [name] will work with SW in next 30 days by telephone or in person to reduce or manage symptoms related to client cognitive issues . Client to communicate regularly in next 30 days with [name], daughter, about needs of client . Client to  attend scheduled client medical appointments in next 30 days  Interventions: . 1:1 collaboration with [name], FNP regarding development and update of comprehensive plan of care as evidenced by provider attestation and co-signature . Chart review completed to assess client needs . Called [name], daughter of client, several times today; but, LCSW was not able to speak via phone with [name] today. LCSW did leave phone message for [name] today requesting that she please call LCSW at [phone] to discuss needs of client  Patient Self Care Activities:  . Attends all scheduled provider appointments  Patient Coping Strengths:  . Family . Hopefulness  Patient Self Care Deficits:  Marland Kitchen Mobility issues . Memory challenges  Patient Goals:  - spend time or talk with others every day - practice relaxation or meditation daily - keep a calendar with appointment dates  Follow Up Plan: LCSW to call client or daughter of client on 12/02/20     [name].Telicia Hodgkiss MSW, LCSW Licensed Clinical Social Worker [facility] Care Management [phone]

## 2020-11-07 ENCOUNTER — Ambulatory Visit: Payer: Medicare Other | Admitting: Nurse Practitioner

## 2020-11-08 ENCOUNTER — Other Ambulatory Visit: Payer: Self-pay

## 2020-11-08 ENCOUNTER — Encounter: Payer: Self-pay | Admitting: Family

## 2020-11-08 ENCOUNTER — Ambulatory Visit (INDEPENDENT_AMBULATORY_CARE_PROVIDER_SITE_OTHER): Payer: Medicare Other | Admitting: Family

## 2020-11-08 ENCOUNTER — Other Ambulatory Visit: Payer: Self-pay | Admitting: Family

## 2020-11-08 VITALS — BP 160/43 | HR 43 | Temp 98.0°F | Ht 61.0 in | Wt 117.8 lb

## 2020-11-08 DIAGNOSIS — Z8679 Personal history of other diseases of the circulatory system: Secondary | ICD-10-CM | POA: Diagnosis not present

## 2020-11-08 DIAGNOSIS — M79605 Pain in left leg: Secondary | ICD-10-CM | POA: Diagnosis not present

## 2020-11-08 DIAGNOSIS — E7800 Pure hypercholesterolemia, unspecified: Secondary | ICD-10-CM

## 2020-11-08 DIAGNOSIS — I10 Essential (primary) hypertension: Secondary | ICD-10-CM | POA: Diagnosis not present

## 2020-11-08 DIAGNOSIS — E119 Type 2 diabetes mellitus without complications: Secondary | ICD-10-CM

## 2020-11-08 MED ORDER — ENALAPRIL MALEATE 20 MG PO TABS: 20.0000 mg | ORAL_TABLET | Freq: Two times a day (BID) | ORAL | 3 refills | Status: DC

## 2020-11-08 MED ORDER — DILTIAZEM HCL 30 MG PO TABS: 30.0000 mg | ORAL_TABLET | Freq: Two times a day (BID) | ORAL | 3 refills | Status: DC

## 2020-11-08 MED ORDER — METFORMIN HCL 500 MG PO TABS: 500.0000 mg | ORAL_TABLET | Freq: Every day | ORAL | 1 refills | Status: DC

## 2020-11-08 MED ORDER — ATORVASTATIN CALCIUM 10 MG PO TABS: 10.0000 mg | ORAL_TABLET | Freq: Every day | ORAL | 3 refills | Status: DC

## 2020-11-08 MED ORDER — MELOXICAM 7.5 MG PO TABS: 7.5000 mg | ORAL_TABLET | Freq: Every day | ORAL | 0 refills | Status: DC

## 2020-11-08 NOTE — Patient Instructions (Signed)
Saphenous Nerve Entrapment  Saphenous nerve entrapment is a condition that results from pressure on a nerve in the leg (saphenous nerve). The saphenous nerve runs down the thigh, along the inner leg, and branches to the ankle and foot. This nerve provides feeling (sensation) to the inner knee and lower leg. Saphenous nerve entrapment can lead to pain, numbness, and loss of feeling in the knee or leg. What are the causes? This condition is caused by pressure being placed on the saphenous nerve in your inner thigh and knee. The pressure is often caused by:  Scar tissue from repetitive injury or from surgery.  Trauma to your knee or leg.  Inflammation where your muscles insert at your inner thigh (pes anserine bursitis). What increases the risk? The following factors may make you more likely to develop this condition:  Participating in: ? Sports in which you may get hit in the leg, such as football or baseball. ? Activities that involve repetitive or strenuous use of the thigh muscles, such as running.  Having surgery on or near your knee. What are the signs or symptoms? Symptoms of this condition include:  Tingling or numbness on the inner side of your knee or leg. Sometimes, this can occur on the inner side of your foot as well.  Pain, burning, or tenderness on the inner side of your knee or lower leg.  A deep ache in your thigh.  Increased pain when doing activities such as running, jumping, kneeling, stair climbing, or prolonged walking. How is this diagnosed? This condition may be diagnosed based on:  Your symptoms and medical history.  A physical exam. During the exam, your health care provider may: ? Press on areas around your knee and thigh. ? Check the motion and strength of your leg. ? Put your leg in positions that place tension on the nerve to see if that increases your symptoms.  Tests to rule out other conditions and to confirm the diagnosis. These may  include: ? An ultrasound or an MRI to check your muscles, tendons, nerves, and other soft tissues, as well as your bones. ? A nerve conduction study to see how well the nerve is carrying a signal. How is this treated? Treatment for this condition may include:  Resting your leg and avoiding activities that increase your pain or symptoms.  Taking medicines to help reduce pain and inflammation.  Applying ice to the area to relieve pain.  Doing massage to ease discomfort and reduce inflammation near the affected area.  Having an injection of an anti-inflammatory medicine into the affected area (cortisone shot).  Doing physical therapy exercises to restore strength and flexibility in your knee and leg.  Having surgery to relieve pressure on the nerve. This may be needed if other treatments do not help. Follow these instructions at home: Managing pain  If directed, put ice on the injured area. ? Put ice in a plastic bag. ? Place a towel between your skin and the bag. ? Leave the ice on for 20 minutes, 2-3 times a day.  Move your toes often to reduce stiffness and swelling.  Raise (elevate) the injured area above the level of your heart while you are sitting or lying down.   Activity  Avoid activities that cause symptoms.  Return to your normal activities as told by your health care provider. Ask your health care provider what activities are safe for you.  Do exercises and massage as told by your health care provider. General instructions  Take over-the-counter and prescription medicines only as told by your health care provider.  Keep all follow-up visits as told by your health care provider. This is important. How is this prevented?  Make sure to use equipment that fits you. Wear padded protective equipment during contact sports.  Warm up and stretch before being active.  Cool down and stretch after being active.  Give your body time to rest between periods of  activity.  Be safe and responsible while being active. This will help you avoid falls. Contact a health care provider if:  You cannot put weight or pressure on your leg without pain.  Your pain and symptoms get worse, even with rest and treatment. Get help right away if:  You cannot move your knee. This may indicate that other structures are damaged. Summary  Saphenous nerve entrapment is a condition that results from pressure on a nerve in the leg.  Saphenous nerve entrapment can lead to pain, numbness, and loss of feeling in the knee or leg.  This condition is caused by pressure being placed on the saphenous nerve in your inner thigh and knee.  Treatment includes rest, ice, medicines, physical therapy, massage, and surgery if needed. This information is not intended to replace advice given to you by your health care provider. Make sure you discuss any questions you have with your health care provider. Document Revised: 10/03/2018 Document Reviewed: 05/27/2018 Elsevier Patient Education  2021 [employer].

## 2020-11-08 NOTE — Progress Notes (Signed)
Subjective:    Patient ID: Julie Riley, female    DOB: April 27, 1932, 87 y.o.   MRN: [id number]  Chief Complaint  Patient presents with  . Leg Pain    Started hurting of the weekend no injury aware of.    Pt presents to the office today with left thigh pain that started on the weekend. Daughter states patient has been walking, but over the weekend has not been able to do any weigh bearing exercises.   She is also requesting refills of medications for DM, A Fib, and hyperlipemia.  Leg Pain  The incident occurred 3 to 5 days ago. There was no injury mechanism. Pain location: left thigh. The quality of the pain is described as aching. The pain is at a severity of 10/10. The pain is moderate. Associated symptoms include muscle weakness. Pertinent negatives include no numbness or tingling. She reports no foreign bodies present. The symptoms are aggravated by movement and weight bearing. She has tried rest and NSAIDs for the symptoms. The treatment provided mild relief.      Review of Systems  Neurological: Negative for tingling and numbness.  All other systems reviewed and are negative.      Objective:   Physical Exam Vitals reviewed.  Constitutional:      General: She is not in acute distress.    Appearance: She is well-developed.  HENT:     Head: Normocephalic and atraumatic.  Eyes:     Pupils: Pupils are equal, round, and reactive to light.  Neck:     Thyroid: No thyromegaly.  Cardiovascular:     Rate and Rhythm: Normal rate and regular rhythm.     Heart sounds: Normal heart sounds. No murmur heard.   Pulmonary:     Effort: Pulmonary effort is normal. No respiratory distress.     Breath sounds: Normal breath sounds. No wheezing.  Abdominal:     General: Bowel sounds are normal. There is no distension.     Palpations: Abdomen is soft.     Tenderness: There is no abdominal tenderness.  Musculoskeletal:        General: Tenderness present.     Cervical back: Normal  range of motion and neck supple.       Legs:     Comments: No swelling, no redness. Tenderness with palpation and weight bearing.   Skin:    General: Skin is warm and dry.  Neurological:     Mental Status: She is alert and oriented to person, place, and time.     Cranial Nerves: No cranial nerve deficit.     Deep Tendon Reflexes: Reflexes are normal and symmetric.  Psychiatric:        Behavior: Behavior normal.        Thought Content: Thought content normal.        Judgment: Judgment normal.       BP (!) 160/43   Pulse (!) 43   Temp 98 F (36.7 C) (Temporal)   Ht 5\' 1"  (1.549 m)   Wt 117 lb 12.8 oz (53.4 kg)   BMI 22.26 kg/m      Assessment & Plan:  Julie Riley comes in today with chief complaint of Leg Pain (Started hurting of the weekend no injury aware of. )   Diagnosis and orders addressed:  1. Essential hypertension - enalapril (VASOTEC) 20 MG tablet; Take 1 tablet (20 mg total) by mouth 2 (two) times daily.  Dispense: 180 tablet; Refill: 3 - diltiazem (  CARDIZEM) 30 MG tablet; Take 1 tablet (30 mg total) by mouth 2 (two) times daily.  Dispense: 180 tablet; Refill: 3  2. History of atrial fibrillation - diltiazem (CARDIZEM) 30 MG tablet; Take 1 tablet (30 mg total) by mouth 2 (two) times daily.  Dispense: 180 tablet; Refill: 3  3. Pure hypercholesterolemia - atorvastatin (LIPITOR) 10 MG tablet; Take 1 tablet (10 mg total) by mouth daily.  Dispense: 90 tablet; Refill: 3  4. Pain of left lower extremity  Rest ROM exercises  No other NSAID's  Pt will come back later in week to get x-ray since we are unable to get done today.  - DG FEMUR MIN 2 VIEWS LEFT; Future - meloxicam (MOBIC) 7.5 MG tablet; Take 1 tablet (7.5 mg total) by mouth daily.  Dispense: 30 tablet; Refill: 0    Jannifer Rodney, FNP

## 2020-11-10 ENCOUNTER — Other Ambulatory Visit: Payer: Self-pay

## 2020-11-10 ENCOUNTER — Other Ambulatory Visit (INDEPENDENT_AMBULATORY_CARE_PROVIDER_SITE_OTHER): Payer: Medicare Other

## 2020-11-10 DIAGNOSIS — M79605 Pain in left leg: Secondary | ICD-10-CM | POA: Diagnosis not present

## 2020-11-11 ENCOUNTER — Telehealth: Payer: Self-pay | Admitting: Family

## 2020-11-11 NOTE — Telephone Encounter (Signed)
Xray is not back yet called and left message. [facility] radiology is backed up

## 2020-11-14 NOTE — Telephone Encounter (Signed)
Patient aware and verbalized understanding. °

## 2020-11-27 ENCOUNTER — Other Ambulatory Visit: Payer: Self-pay | Admitting: Family

## 2020-11-27 DIAGNOSIS — R413 Other amnesia: Secondary | ICD-10-CM

## 2020-12-02 ENCOUNTER — Telehealth: Payer: Medicare Other

## 2021-01-17 ENCOUNTER — Telehealth: Payer: Medicare Other

## 2021-01-17 ENCOUNTER — Telehealth: Payer: Self-pay | Admitting: Licensed Clinical Social Worker

## 2021-01-18 NOTE — Telephone Encounter (Signed)
  Chronic Care Management   Social Work Note  01/18/2021 Name: Julie Riley MRN: [id number] DOB: 1931/10/19  Julie Riley is a 87 y.o. year old female who sees Perth, Edilia Bo, FNP for primary care. The CCM team was consulted for assistance with community resources  LCSW called phone number several times for [name], daughter of client; but, LCSW was not able to speak via phone with [name] . LCSW did leave phone message for Julie Riley asking her to please call LCSW at [phone]  Follow Up Plan: LCSW to call client or daughter, Julie Riley, as scheduled, to assess needs of client  [name].Brandalyn Harting MSW, LCSW Licensed Clinical Social Worker [facility] Care Management [phone]

## 2021-01-27 ENCOUNTER — Telehealth: Payer: Self-pay | Admitting: *Deleted

## 2021-01-27 NOTE — Chronic Care Management (AMB) (Signed)
  Care Management   Note  01/27/2021 Name: CINA KLUMPP MRN: [id number] DOB: March 04, 1932  Elnita Maxwell Oneil is a 87 y.o. year old female who is a primary care patient of [name], FNP and is actively engaged with the care management team. I reached out to [name] by phone today to assist with re-scheduling a follow up visit with the Licensed Clinical Social Worker  Follow up plan: Unsuccessful telephone outreach attempt made. A HIPAA compliant phone message was left for the patient providing contact information and requesting a return call.  The care management team will reach out to the patient again over the next 7-14 days.  If patient returns call to provider office, please advise to call Embedded Care Management Care Guide [name] at [phone].  Gwenevere Ghazi  Care Guide, Embedded Care Coordination [facility] Management  Direct Dial: [phone]

## 2021-02-03 ENCOUNTER — Ambulatory Visit: Payer: Medicare Other | Admitting: Family

## 2021-02-10 NOTE — Chronic Care Management (AMB) (Signed)
  Care Management   Note  02/10/2021 Name: Julie Riley MRN: [id number] DOB: [birth year]  Julie Riley is a 87 y.o. year old female who is a primary care patient of [name], FNP and is actively engaged with the care management team. I reached out to [name] by phone today to assist with re-scheduling a follow up visit with the Licensed Clinical Social Worker  Follow up plan: A second unsuccessful telephone outreach attempt made. A HIPAA compliant phone message was left for the patient providing contact information and requesting a return call. The care management team will reach out to the patient again over the next 7 days. If patient returns call to provider office, please advise to call Embedded Care Management Care Guide [name] at [phone].  Gwenevere Ghazi  Care Guide, Embedded Care Coordination [facility] Management  Direct Dial: [phone]

## 2021-02-17 NOTE — Chronic Care Management (AMB) (Signed)
  Care Management   Note  02/17/2021 Name: MICHAL STRZELECKI MRN: [id number] DOB: 1931-08-25  Julie Riley is a 87 y.o. year old female who is a primary care patient of [name], FNP and is actively engaged with the care management team. I reached out to [name] by phone today to assist with re-scheduling a follow up visit with the Licensed Clinical Social Worker  Follow up plan: A third unsuccessful telephone outreach attempt made. A HIPAA compliant phone message was left for the patient providing contact information and requesting a return call. We have been unable to make contact with the patient for follow up. The care management team is available to follow up with the patient after provider conversation with the patient regarding recommendation for care management engagement and subsequent re-referral to the care management team.   [facility] Guide, Embedded Care Coordination [facility]  Care Management  Direct Dial: [phone]

## 2021-03-07 ENCOUNTER — Telehealth: Payer: Self-pay | Admitting: Family

## 2021-03-07 NOTE — Telephone Encounter (Signed)
Left message for patient to call back and schedule Medicare Annual Wellness Visit (AWV) either virtually or in office. I gave both office number and my number [phone]   Last AWV 12/18/19  please schedule at anytime with health coach  This should be a 45 minute visit.

## 2021-04-17 ENCOUNTER — Other Ambulatory Visit: Payer: Self-pay

## 2021-04-17 ENCOUNTER — Encounter: Payer: Self-pay | Admitting: Family

## 2021-04-17 ENCOUNTER — Ambulatory Visit (INDEPENDENT_AMBULATORY_CARE_PROVIDER_SITE_OTHER): Payer: Medicare Other | Admitting: Family

## 2021-04-17 VITALS — BP 164/66 | HR 60 | Temp 98.0°F | Ht 61.0 in | Wt 120.0 lb

## 2021-04-17 DIAGNOSIS — Z23 Encounter for immunization: Secondary | ICD-10-CM | POA: Diagnosis not present

## 2021-04-17 DIAGNOSIS — I10 Essential (primary) hypertension: Secondary | ICD-10-CM

## 2021-04-17 DIAGNOSIS — F03B18 Unspecified dementia, moderate, with other behavioral disturbance: Secondary | ICD-10-CM | POA: Diagnosis not present

## 2021-04-17 DIAGNOSIS — R413 Other amnesia: Secondary | ICD-10-CM | POA: Diagnosis not present

## 2021-04-17 DIAGNOSIS — E7800 Pure hypercholesterolemia, unspecified: Secondary | ICD-10-CM

## 2021-04-17 DIAGNOSIS — E119 Type 2 diabetes mellitus without complications: Secondary | ICD-10-CM

## 2021-04-17 LAB — BAYER DCA HB A1C WAIVED: HB A1C (BAYER DCA - WAIVED): 6.3 % — ABNORMAL HIGH (ref 4.8–5.6)

## 2021-04-17 MED ORDER — MEMANTINE HCL 5 MG PO TABS: 5.0000 mg | ORAL_TABLET | Freq: Two times a day (BID) | ORAL | 1 refills | Status: DC

## 2021-04-17 NOTE — Patient Instructions (Signed)

## 2021-04-17 NOTE — Progress Notes (Signed)
Subjective:    Patient ID: Julie Riley, female    DOB: Mar 07, 1932, 87 y.o.   MRN: [id number]  Chief Complaint  Patient presents with   Dementia   Pt presents to the office today with daughter. She is complaining of memory changes. She states at home by herself. She has two daughters who check on her daily.   She has taken aricept in the past, but stopped because it was causing nightmares.  Diabetes She presents for her follow-up diabetic visit. She has type 2 diabetes mellitus. Associated symptoms include foot paresthesias. Pertinent negatives for diabetes include no blurred vision. Symptoms are stable. Diabetic complications include heart disease. Risk factors for coronary artery disease include dyslipidemia, diabetes mellitus, hypertension, sedentary lifestyle and post-menopausal. She is following a generally unhealthy diet. Her overall blood glucose range is 140-180 mg/dl.  Hyperlipidemia This is a chronic problem. The current episode started more than 1 year ago. Pertinent negatives include no shortness of breath. Current antihyperlipidemic treatment includes diet change. The current treatment provides mild improvement of lipids. Risk factors for coronary artery disease include dyslipidemia, hypertension and a sedentary lifestyle.  Hypertension This is a chronic problem. The current episode started more than 1 year ago. Associated symptoms include peripheral edema. Pertinent negatives include no blurred vision, malaise/fatigue or shortness of breath. Risk factors for coronary artery disease include dyslipidemia and sedentary lifestyle. The current treatment provides moderate improvement.     Review of Systems  Constitutional:  Negative for malaise/fatigue.  Eyes:  Negative for blurred vision.  Respiratory:  Negative for shortness of breath.   All other systems reviewed and are negative.     Objective:   Physical Exam Vitals reviewed.  Constitutional:      General: She is not  in acute distress.    Appearance: She is well-developed.  HENT:     Head: Normocephalic and atraumatic.     Right Ear: Tympanic membrane normal.     Left Ear: Tympanic membrane normal.  Eyes:     Pupils: Pupils are equal, round, and reactive to light.  Neck:     Thyroid: No thyromegaly.  Cardiovascular:     Rate and Rhythm: Normal rate and regular rhythm.     Heart sounds: Normal heart sounds. No murmur heard. Pulmonary:     Effort: Pulmonary effort is normal. No respiratory distress.     Breath sounds: Normal breath sounds. No wheezing.  Abdominal:     General: Bowel sounds are normal. There is no distension.     Palpations: Abdomen is soft.     Tenderness: There is no abdominal tenderness.  Musculoskeletal:        General: No tenderness. Normal range of motion.     Cervical back: Normal range of motion and neck supple.  Skin:    General: Skin is warm and dry.  Neurological:     Mental Status: She is alert and oriented to person, place, and time.     Cranial Nerves: No cranial nerve deficit.     Deep Tendon Reflexes: Reflexes are normal and symmetric.  Psychiatric:        Behavior: Behavior normal.        Thought Content: Thought content normal.        Cognition and Memory: Cognition is impaired. Memory is impaired. She exhibits impaired recent memory.        Judgment: Judgment normal.      There were no vitals taken for this visit.  Assessment & Plan:  Julie Riley comes in today with chief complaint of Dementia   Diagnosis and orders addressed:  1. Essential hypertension - CMP14+EGFR - CBC with Differential/Platelet  2. Type 2 diabetes mellitus without complication, without long-term current use of insulin (HCC) - CMP14+EGFR - Bayer DCA Hb A1c Waived - CBC with Differential/Platelet  3. Moderate dementia with other behavioral disturbance, unspecified dementia type - CMP14+EGFR - CBC with Differential/Platelet  4. Memory changes - memantine  (NAMENDA) 5 MG tablet; Take 1 tablet (5 mg total) by mouth 2 (two) times daily.  Dispense: 180 tablet; Refill: 1 - CMP14+EGFR - CBC with Differential/Platelet  5. Pure hypercholesterolemia - CMP14+EGFR - CBC with Differential/Platelet  6. Need for immunization against influenza - Flu Vaccine QUAD High Dose(Fluad)   Labs pending Health Maintenance reviewed Diet and exercise encouraged  Follow up plan: 3 months    Evelina Dun, FNP

## 2021-04-18 LAB — CMP14+EGFR
ALT: 11 IU/L (ref 0–32)
AST: 17 IU/L (ref 0–40)
Albumin/Globulin Ratio: 1.5 (ref 1.2–2.2)
Albumin: 4 g/dL (ref 3.6–4.6)
Alkaline Phosphatase: 111 IU/L (ref 44–121)
BUN/Creatinine Ratio: 22 (ref 12–28)
BUN: 15 mg/dL (ref 8–27)
Bilirubin Total: 0.3 mg/dL (ref 0.0–1.2)
CO2: 23 mmol/L (ref 20–29)
Calcium: 9.5 mg/dL (ref 8.7–10.3)
Chloride: 104 mmol/L (ref 96–106)
Creatinine, Ser: 0.68 mg/dL (ref 0.57–1.00)
Globulin, Total: 2.6 g/dL (ref 1.5–4.5)
Glucose: 93 mg/dL (ref 70–99)
Potassium: 4.7 mmol/L (ref 3.5–5.2)
Sodium: 142 mmol/L (ref 134–144)
Total Protein: 6.6 g/dL (ref 6.0–8.5)
eGFR: 84 mL/min/{1.73_m2} (ref >59)

## 2021-04-18 LAB — CBC WITH DIFFERENTIAL/PLATELET
Basophils Absolute: 0.1 10*3/uL (ref 0.0–0.2)
Basos: 1 %
EOS (ABSOLUTE): 0.2 10*3/uL (ref 0.0–0.4)
Eos: 3 %
Hematocrit: 36.2 % (ref 34.0–46.6)
Hemoglobin: 12.6 g/dL (ref 11.1–15.9)
Immature Grans (Abs): 0 10*3/uL (ref 0.0–0.1)
Immature Granulocytes: 0 %
Lymphocytes Absolute: 1.6 10*3/uL (ref 0.7–3.1)
Lymphs: 24 %
MCH: 31.4 pg (ref 26.6–33.0)
MCHC: 34.8 g/dL (ref 31.5–35.7)
MCV: 90 fL (ref 79–97)
Monocytes Absolute: 0.6 10*3/uL (ref 0.1–0.9)
Monocytes: 8 %
Neutrophils Absolute: 4.5 10*3/uL (ref 1.4–7.0)
Neutrophils: 64 %
Platelets: 210 10*3/uL (ref 150–450)
RBC: 4.01 x10E6/uL (ref 3.77–5.28)
RDW: 12.8 % (ref 11.7–15.4)
WBC: 6.9 10*3/uL (ref 3.4–10.8)

## 2021-04-27 ENCOUNTER — Telehealth: Payer: Self-pay | Admitting: Family

## 2021-04-27 NOTE — Telephone Encounter (Signed)
Left message for patient to call back and schedule Medicare Annual Wellness Visit (AWV) to be completed by video or phone.   Last AWV: 12/18/2019  Please schedule at anytime with [facility] Health Advisor.  45 minute appointment  Any questions, please contact me at [phone]

## 2021-05-15 ENCOUNTER — Ambulatory Visit (INDEPENDENT_AMBULATORY_CARE_PROVIDER_SITE_OTHER): Payer: Medicare Other | Admitting: Licensed Clinical Social Worker

## 2021-05-15 DIAGNOSIS — E559 Vitamin D deficiency, unspecified: Secondary | ICD-10-CM

## 2021-05-15 DIAGNOSIS — I10 Essential (primary) hypertension: Secondary | ICD-10-CM

## 2021-05-15 DIAGNOSIS — Z8679 Personal history of other diseases of the circulatory system: Secondary | ICD-10-CM

## 2021-05-15 DIAGNOSIS — E119 Type 2 diabetes mellitus without complications: Secondary | ICD-10-CM

## 2021-05-15 DIAGNOSIS — F03B18 Unspecified dementia, moderate, with other behavioral disturbance: Secondary | ICD-10-CM

## 2021-05-15 NOTE — Chronic Care Management (AMB) (Signed)
Chronic Care Management    Clinical Social Work Note  05/15/2021 Name: Julie Riley MRN: [id number] DOB: 06/13/1932  Julie Riley is a 87 y.o. year old female who is a primary care patient of [name], FNP. The CCM team was consulted to assist the patient with chronic disease management and/or care coordination needs related to: [name] .   Engaged with patient /daughter of patient, Julie Riley,  by telephone for follow up visit in response to provider referral for social work chronic care management and care coordination services.   Consent to Services:  The patient was given information about Chronic Care Management services, agreed to services, and gave verbal consent prior to initiation of services.  Please see initial visit note for detailed documentation.   Patient agreed to services and consent obtained.   Assessment: Review of patient past medical history, allergies, medications, and health status, including review of relevant consultants reports was performed today as part of a comprehensive evaluation and provision of chronic care management and care coordination services.     SDOH (Social Determinants of Health) assessments and interventions performed:  SDOH Interventions    Flowsheet Row Most Recent Value  SDOH Interventions   Physical Activity Interventions Other (Comments)  [client has walking challenges. she has a cane and a walker to uses as needed to help her walk]  Stress Interventions --  [client has stress in managing medical needs. client has trouble trusting caregivers. has difficulty sleeping. has hygiene issues. sometimes does not want to take a bath]  Depression Interventions/Treatment  --  [informed [name], daughter of client, of LCSW support for client and of RNCM support for client]        Advanced Directives Status: See [employer]yn[employer] application for related entries.  CCM Care Plan  Allergies  Allergen Reactions    Amoxicillin    Penicillins    Xarelto [Rivaroxaban] Other (See Comments)    Severe diarrhea    Outpatient Encounter Medications as of 05/15/2021  Medication Sig   aspirin 325 MG EC tablet Take 325 mg by mouth daily.   atorvastatin (LIPITOR) 10 MG tablet Take 1 tablet (10 mg total) by mouth daily. (Patient not taking: Reported on 04/17/2021)   cholecalciferol (VITAMIN D) 25 MCG (1000 UNIT) tablet TAKE 1 CAPSULE BY MOUTH EVERY DAY (Patient not taking: Reported on 04/17/2021)   Cholecalciferol (VITAMIN D3) 50 MCG (2000 UT) capsule TAKE 1 TABLET BY MOUTH EVERY DAY (Patient not taking: Reported on 04/17/2021)   diltiazem (CARDIZEM) 30 MG tablet Take 1 tablet (30 mg total) by mouth 2 (two) times daily.   enalapril (VASOTEC) 20 MG tablet Take 1 tablet (20 mg total) by mouth 2 (two) times daily.   glucose blood test strip Use to check blood sugars two times daily   meloxicam (MOBIC) 7.5 MG tablet Take 1 tablet (7.5 mg total) by mouth daily. (Patient not taking: Reported on 04/17/2021)   memantine (NAMENDA) 5 MG tablet Take 1 tablet (5 mg total) by mouth 2 (two) times daily.   metFORMIN (GLUCOPHAGE) 500 MG tablet Take 1 tablet (500 mg total) by mouth daily with breakfast. (Patient not taking: Reported on 04/17/2021)   ReliOn Lancets Micro-Thin 33G MISC Use to check blood sugars two times daily   No facility-administered encounter medications on file as of 05/15/2021.    Patient Active Problem List   Diagnosis Date Noted   Dementia (HCC) 11/16/2019   Memory changes 11/02/2019   Essential hypertension 07/18/2016  History of atrial fibrillation 07/18/2016   Type 2 diabetes mellitus without complication, without long-term current use of insulin (HCC) 07/18/2016   Neuropathy 07/18/2016   Vitamin D deficiency 07/18/2016   OAB (overactive bladder) 07/18/2016   Pure hypercholesterolemia 07/18/2016   Carotid stenosis     Conditions to be addressed/monitored: monitor client completion of ADLs as  she is able  Care Plan : LCSW care plan  Updates made by Isaiah Blakes, LCSW since 05/15/2021 12:00 AM     Problem: Cognitive Function      Goal: Manage memory issues for client. Client to complete ADLs as she is able   Start Date: 05/15/2021  Expected End Date: 08/14/2021  This Visit's Progress: Not on track  Recent Progress: On track  Priority: High  Note:   Current Barriers:  Memory issues; cognitive decline Mobility issues Difficulty completing ADLs Suicidal Ideation/Homicidal Ideation: No  Clinical Social Work Goal(s):  patient Julie Riley will work with SW in next 30 days by telephone or in person to reduce or manage symptoms related to client cognitive issues Client to communicate regularly in next 30 days with [name], daughter, about needs of client Client to attend scheduled client medical appointments in next 30 days Client or her daughter, Julie Riley, to communicate as needed in next 30 days with RNCM to discuss nursing needs of client  Interventions: 1:1 collaboration with [name], FNP regarding development and update of comprehensive plan of care as evidenced by provider attestation and co-signature Discussed with [name], daughter of client, the current needs of client Reviewed with Julie Riley client appetite challenges. Julie Riley said that client has reduced appetite. Discussed with Julie Riley the in home care needs of client. Julie Riley and LCSW spoke of FL-2 completion through [facility]. Julie Riley said client has appointment at [facility] on 06/13/21 to meet with medical provider and discuss needs for placement for client and to discuss FL-2 completion Reviewed with Julie Riley pain issues of client and sleeping issues of client Discussed [facility] for possible client care. Discussed [facility] in [place], [place] for possible client care. Provided  counseling support for Julie Riley related to client needs Discussed Medicaid application process for client with Aletha Allebach Discussed  confusion issues of client Discussed ADTS in home support services with [name]. LCSW gave [name] phone number for ADTS. Reviewed client family support. Client has support from her son and from her two daughters  Patient Self Care Activities:  Attends all scheduled provider appointments  Patient Coping Strengths:  Family Hopefulness  Patient Self Care Deficits:  Mobility issues Memory challenges  Patient Goals:  - spend time or talk with others every day - practice relaxation or meditation daily - keep a calendar with appointment dates  Follow Up Plan: LCSW to call client or daughter of client on 07/14/21 at 3:15 PM to assess client needs      [name].Joseff Luckman MSW, LCSW Licensed Clinical Social Worker [facility] Care Management [phone]

## 2021-05-15 NOTE — Patient Instructions (Addendum)
Visit Information  Patient Goals: Protect My Health (Patient).   Manage memory issues of client. Client to complete ADLs as she is able  Timeframe:  Short-Term Goal Priority:  High Progress:  Not On Track Start Date:             05/15/21               Expected End Date:          08/14/21             Follow Up Date  07/14/21 at 3:15 PM   Protect My Health (Patient) Manage memory issues of client . Client to complete ADL as she is able   Why is this important?   Screening tests can find diseases early when they are easier to treat.  Your doctor or nurse will talk with you about which tests are important for you.  Getting shots for common diseases like the flu and shingles will help prevent them.    Patient Self Care Activities:  Attends all scheduled provider appointments  Patient Coping Strengths:  Family Hopefulness  Patient Self Care Deficits:  Mobility issues Memory challenges  Patient Goals:  - spend time or talk with others every day - practice relaxation or meditation daily - keep a calendar with appointment dates  Follow Up Plan: LCSW to call client  or daughter of client on 07/14/21 at 3:15 PM to assess needs of client    [name].Clemma Johnsen MSW, LCSW Licensed Clinical Social Worker [facility] Care Management [phone]

## 2021-05-20 ENCOUNTER — Other Ambulatory Visit: Payer: Self-pay | Admitting: Family

## 2021-06-13 ENCOUNTER — Encounter: Payer: Self-pay | Admitting: Family

## 2021-06-13 ENCOUNTER — Ambulatory Visit (INDEPENDENT_AMBULATORY_CARE_PROVIDER_SITE_OTHER): Payer: Medicare Other | Admitting: Family

## 2021-06-13 VITALS — BP 146/82 | HR 100 | Temp 97.5°F | Ht 61.0 in | Wt 121.0 lb

## 2021-06-13 DIAGNOSIS — E7800 Pure hypercholesterolemia, unspecified: Secondary | ICD-10-CM | POA: Diagnosis not present

## 2021-06-13 DIAGNOSIS — I10 Essential (primary) hypertension: Secondary | ICD-10-CM | POA: Diagnosis not present

## 2021-06-13 DIAGNOSIS — R413 Other amnesia: Secondary | ICD-10-CM

## 2021-06-13 DIAGNOSIS — E119 Type 2 diabetes mellitus without complications: Secondary | ICD-10-CM

## 2021-06-13 DIAGNOSIS — F03B18 Unspecified dementia, moderate, with other behavioral disturbance: Secondary | ICD-10-CM

## 2021-06-13 NOTE — Patient Instructions (Signed)

## 2021-06-13 NOTE — Progress Notes (Signed)
Subjective:    Patient ID: Julie Riley, female    DOB: [birth year], 87 y.o.   MRN: [id number]  Chief Complaint  Patient presents with   [facility]   PT presents to the office today to discuss FL2 and nursing home placement. Pt has dementia and currently living at home by herself. She has family that checks on her daily. Daughter states she is getting concern for safety because her memory has decreased significantly.  Hypertension This is a chronic problem. The current episode started more than 1 year ago. The problem has been waxing and waning since onset. The problem is uncontrolled. Associated symptoms include malaise/fatigue and peripheral edema. Pertinent negatives include no shortness of breath. The current treatment provides moderate improvement.  Diabetes She presents for her follow-up diabetic visit. She has type 2 diabetes mellitus. Symptoms are stable. Risk factors for coronary artery disease include dyslipidemia, diabetes mellitus, hypertension, sedentary lifestyle and post-menopausal. (Does not checking at home)  Hyperlipidemia This is a chronic problem. The current episode started more than 1 year ago. The problem is controlled. Exacerbating diseases include obesity. There are no known factors aggravating her hyperlipidemia. Pertinent negatives include no shortness of breath. Current antihyperlipidemic treatment includes statins. The current treatment provides moderate improvement of lipids. Risk factors for coronary artery disease include dyslipidemia, hypertension, a sedentary lifestyle and post-menopausal.     Review of Systems  Constitutional:  Positive for malaise/fatigue.  Respiratory:  Negative for shortness of breath.   All other systems reviewed and are negative.     Objective:   Physical Exam Vitals reviewed.  Constitutional:      General: She is not in acute distress.    Appearance: She is well-developed.  HENT:     Head: Normocephalic and atraumatic.     Right  Ear: Tympanic membrane normal.     Left Ear: Tympanic membrane normal.  Eyes:     Pupils: Pupils are equal, round, and reactive to light.  Neck:     Thyroid: No thyromegaly.  Cardiovascular:     Rate and Rhythm: Normal rate and regular rhythm.     Heart sounds: Normal heart sounds. No murmur heard. Pulmonary:     Effort: Pulmonary effort is normal. No respiratory distress.     Breath sounds: Normal breath sounds. No wheezing.  Abdominal:     General: Bowel sounds are normal. There is no distension.     Palpations: Abdomen is soft.     Tenderness: There is no abdominal tenderness.  Musculoskeletal:        General: No tenderness. Normal range of motion.     Cervical back: Normal range of motion and neck supple.     Right lower leg: Edema (tracae) present.     Left lower leg: Edema (trace) present.  Skin:    General: Skin is warm and dry.  Neurological:     Mental Status: She is alert and oriented to person, place, and time.     Cranial Nerves: No cranial nerve deficit.     Deep Tendon Reflexes: Reflexes are normal and symmetric.  Psychiatric:        Behavior: Behavior normal.        Thought Content: Thought content normal.        Cognition and Memory: Cognition is impaired. Memory is impaired. She exhibits impaired recent memory.        Judgment: Judgment normal.         BP (!) 146/82    Pulse  100    Temp (!) 97.5 F (36.4 C) (Temporal)    Ht 5\' 1"  (1.549 m)    Wt 121 lb (54.9 kg)    BMI 22.86 kg/m   Assessment & Plan:  Julie Riley comes in today with chief complaint of FL2   Diagnosis and orders addressed:  1. Essential hypertension  2. Type 2 diabetes mellitus without complication, without long-term current use of insulin (HCC)  3. Moderate dementia with other behavioral disturbance, unspecified dementia type  4. Memory changes  5. Pure hypercholesterolemia    Labs reviewed FL2 completed Health Maintenance reviewed Diet and exercise  encouraged  Follow up plan: 6 months   Julie Maxwell, FNP

## 2021-07-02 ENCOUNTER — Other Ambulatory Visit: Payer: Self-pay

## 2021-07-02 ENCOUNTER — Emergency Department (HOSPITAL_COMMUNITY)
Admission: EM | Admit: 2021-07-02 | Discharge: 2021-07-02 | Disposition: A | Discharge status: Home / Self Care | Payer: Medicare Other | Attending: Emergency Medicine | Admitting: Emergency Medicine

## 2021-07-02 ENCOUNTER — Emergency Department (HOSPITAL_COMMUNITY): Payer: Medicare Other

## 2021-07-02 ENCOUNTER — Encounter (HOSPITAL_COMMUNITY): Payer: Self-pay | Admitting: *Deleted

## 2021-07-02 DIAGNOSIS — Z20822 Contact with and (suspected) exposure to covid-19: Secondary | ICD-10-CM | POA: Diagnosis not present

## 2021-07-02 DIAGNOSIS — R4182 Altered mental status, unspecified: Secondary | ICD-10-CM | POA: Diagnosis present

## 2021-07-02 DIAGNOSIS — E119 Type 2 diabetes mellitus without complications: Secondary | ICD-10-CM | POA: Diagnosis not present

## 2021-07-02 DIAGNOSIS — R410 Disorientation, unspecified: Secondary | ICD-10-CM | POA: Insufficient documentation

## 2021-07-02 DIAGNOSIS — F0390 Unspecified dementia without behavioral disturbance: Secondary | ICD-10-CM | POA: Diagnosis not present

## 2021-07-02 DIAGNOSIS — R531 Weakness: Secondary | ICD-10-CM

## 2021-07-02 DIAGNOSIS — R42 Dizziness and giddiness: Secondary | ICD-10-CM | POA: Diagnosis not present

## 2021-07-02 DIAGNOSIS — Z7984 Long term (current) use of oral hypoglycemic drugs: Secondary | ICD-10-CM | POA: Insufficient documentation

## 2021-07-02 DIAGNOSIS — I2510 Atherosclerotic heart disease of native coronary artery without angina pectoris: Secondary | ICD-10-CM | POA: Insufficient documentation

## 2021-07-02 DIAGNOSIS — Z7982 Long term (current) use of aspirin: Secondary | ICD-10-CM | POA: Diagnosis not present

## 2021-07-02 DIAGNOSIS — Z79899 Other long term (current) drug therapy: Secondary | ICD-10-CM | POA: Diagnosis not present

## 2021-07-02 DIAGNOSIS — I119 Hypertensive heart disease without heart failure: Secondary | ICD-10-CM | POA: Insufficient documentation

## 2021-07-02 DIAGNOSIS — N3001 Acute cystitis with hematuria: Secondary | ICD-10-CM | POA: Insufficient documentation

## 2021-07-02 HISTORY — DX: Unspecified dementia, unspecified severity, without behavioral disturbance, psychotic disturbance, mood disturbance, and anxiety: F03.90

## 2021-07-02 LAB — CBC WITH DIFFERENTIAL/PLATELET
Abs Immature Granulocytes: 0.02 10*3/uL (ref 0.00–0.07)
Basophils Absolute: 0 10*3/uL (ref 0.0–0.1)
Basophils Relative: 1 %
Eosinophils Absolute: 0.2 10*3/uL (ref 0.0–0.5)
Eosinophils Relative: 4 %
HCT: 37.8 % (ref 36.0–46.0)
Hemoglobin: 12.7 g/dL (ref 12.0–15.0)
Immature Granulocytes: 0 %
Lymphocytes Relative: 26 %
Lymphs Abs: 1.5 10*3/uL (ref 0.7–4.0)
MCH: 31.6 pg (ref 26.0–34.0)
MCHC: 33.6 g/dL (ref 30.0–36.0)
MCV: 94 fL (ref 80.0–100.0)
Monocytes Absolute: 0.6 10*3/uL (ref 0.1–1.0)
Monocytes Relative: 11 %
Neutro Abs: 3.3 10*3/uL (ref 1.7–7.7)
Neutrophils Relative %: 58 %
Platelets: 220 10*3/uL (ref 150–400)
RBC: 4.02 MIL/uL (ref 3.87–5.11)
RDW: 12.7 % (ref 11.5–15.5)
WBC: 5.7 10*3/uL (ref 4.0–10.5)
nRBC: 0 % (ref 0.0–0.2)

## 2021-07-02 LAB — COMPREHENSIVE METABOLIC PANEL
ALT: 19 U/L (ref 0–44)
AST: 22 U/L (ref 15–41)
Albumin: 3.7 g/dL (ref 3.5–5.0)
Alkaline Phosphatase: 86 U/L (ref 38–126)
Anion gap: 7 (ref 5–15)
BUN: 18 mg/dL (ref 8–23)
CO2: 27 mmol/L (ref 22–32)
Calcium: 8.7 mg/dL — ABNORMAL LOW (ref 8.9–10.3)
Chloride: 104 mmol/L (ref 98–111)
Creatinine, Ser: 0.89 mg/dL (ref 0.44–1.00)
GFR, Estimated: >60 mL/min (ref >60)
Glucose, Bld: 156 mg/dL — ABNORMAL HIGH (ref 70–99)
Potassium: 3.6 mmol/L (ref 3.5–5.1)
Sodium: 138 mmol/L (ref 135–145)
Total Bilirubin: 0.1 mg/dL — ABNORMAL LOW (ref 0.3–1.2)
Total Protein: 6.7 g/dL (ref 6.5–8.1)

## 2021-07-02 LAB — URINALYSIS, ROUTINE W REFLEX MICROSCOPIC
Bilirubin Urine: NEGATIVE
Glucose, UA: 50 mg/dL — AB
Ketones, ur: NEGATIVE mg/dL
Nitrite: NEGATIVE
Protein, ur: NEGATIVE mg/dL
Specific Gravity, Urine: 1.013 (ref 1.005–1.030)
WBC, UA: >50 WBC/hpf — ABNORMAL HIGH (ref 0–5)
pH: 6 (ref 5.0–8.0)

## 2021-07-02 LAB — RESP PANEL BY RT-PCR (FLU A&B, COVID) ARPGX2
Influenza A by PCR: NEGATIVE
Influenza B by PCR: NEGATIVE
SARS Coronavirus 2 by RT PCR: NEGATIVE

## 2021-07-02 LAB — CBG MONITORING, ED: Glucose-Capillary: 163 mg/dL — ABNORMAL HIGH (ref 70–99)

## 2021-07-02 MED ORDER — SULFAMETHOXAZOLE-TRIMETHOPRIM 800-160 MG PO TABS: 1.0000 | ORAL_TABLET | Freq: Two times a day (BID) | ORAL | 0 refills | Status: AC

## 2021-07-02 MED ORDER — CIPROFLOXACIN HCL 250 MG PO TABS
500.0000 mg | ORAL_TABLET | Freq: Once | ORAL | Status: AC
  Administered 2021-07-02: 500 mg via ORAL
  Filled 2021-07-02: qty 2

## 2021-07-02 NOTE — ED Notes (Signed)
Ambulatory to restroom with family.

## 2021-07-02 NOTE — ED Notes (Signed)
Order placed for SW assist. Daughter states that they are trying to get her set up with home health but so far has been unsuccessful. Would like assistance during the day since so many family members are working.

## 2021-07-02 NOTE — ED Notes (Signed)
Pt ambulatory back to room.

## 2021-07-02 NOTE — ED Triage Notes (Signed)
Pt with hx of dementia, family member states pt has been worse for past 2-3 days, stating she is wanting to die and seeing her late husband that died two years ago.  Eating and drinking for her usual.  Lives at home by herself, several family members check on her throughout the day.

## 2021-07-02 NOTE — ED Notes (Signed)
Pt to xr then ct

## 2021-07-02 NOTE — Discharge Instructions (Signed)
Drink plenty of fluids and follow-up with your family doctor later this week for recheck °

## 2021-07-02 NOTE — ED Provider Notes (Signed)
[facility] EMERGENCY DEPARTMENT Provider Note   CSN: [id number] Arrival date & time: 07/02/21  1824     History  Chief Complaint  Patient presents with   Altered Mental Status    Julie Riley is a 88 y.o. female.  Patient has a history of dementia, diabetes hypertension coronary artery disease.  She is more confused than normal according to family.  The history is provided by the patient and a relative. No language interpreter was used.  Altered Mental Status Presenting symptoms: behavior changes   Severity:  Moderate Most recent episode:  Today Episode history:  Multiple Timing:  Constant Progression:  Waxing and waning Chronicity:  Recurrent Context: not alcohol use       Home Medications Prior to Admission medications   Medication Sig Start Date End Date Taking? Authorizing Provider  sulfamethoxazole-trimethoprim (BACTRIM DS) 800-160 MG tablet Take 1 tablet by mouth 2 (two) times daily for 7 days. 07/02/21 07/09/21 Yes Milton Ferguson, MD  aspirin 325 MG EC tablet Take 325 mg by mouth daily.    [provider]  atorvastatin (LIPITOR) 10 MG tablet Take 1 tablet (10 mg total) by mouth daily. Patient not taking: Reported on 04/17/2021 11/08/20   Evelina Dun A, FNP  cholecalciferol (VITAMIN D) 25 MCG (1000 UNIT) tablet TAKE 1 CAPSULE BY MOUTH EVERY DAY Patient not taking: Reported on 04/17/2021 11/07/20   [provider]  Cholecalciferol (VITAMIN D3) 50 MCG (2000 UT) capsule TAKE 1 TABLET BY MOUTH EVERY DAY 11/28/20   Evelina Dun A, FNP  diltiazem (CARDIZEM) 30 MG tablet Take 1 tablet (30 mg total) by mouth 2 (two) times daily. Patient not taking: Reported on 06/13/2021 11/08/20   Evelina Dun A, FNP  enalapril (VASOTEC) 20 MG tablet Take 1 tablet (20 mg total) by mouth 2 (two) times daily. Patient not taking: Reported on 06/13/2021 11/08/20   Evelina Dun A, FNP  glucose blood test strip Use to check blood sugars two times daily Patient not taking:  Reported on 06/13/2021 07/14/19   Terald Sleeper, PA-C  meloxicam (MOBIC) 7.5 MG tablet Take 1 tablet (7.5 mg total) by mouth daily. Patient not taking: Reported on 04/17/2021 11/08/20   Sharion Balloon, FNP  memantine (NAMENDA) 5 MG tablet Take 1 tablet (5 mg total) by mouth 2 (two) times daily. Patient not taking: Reported on 06/13/2021 04/17/21   Sharion Balloon, FNP  metFORMIN (GLUCOPHAGE) 500 MG tablet TAKE 1 TABLET BY MOUTH DAILY WITH BREAKFAST 05/22/21   Evelina Dun A, FNP  ReliOn Lancets Micro-Thin 33G MISC Use to check blood sugars two times daily Patient not taking: Reported on 06/13/2021 07/14/19   Terald Sleeper, PA-C      Allergies    Amoxicillin, Penicillins, and Xarelto [rivaroxaban]    Review of Systems   Review of Systems  Unable to perform ROS: Dementia   Physical Exam Updated Vital Signs BP (!) 157/80    Pulse 70    Temp (!) 97.3 F (36.3 C) (Oral)    Resp 19    Ht 5\' 1"  (1.549 m)    Wt 55.6 kg    SpO2 98%    BMI 23.17 kg/m  Physical Exam Vitals and nursing note reviewed.  Constitutional:      Appearance: She is well-developed.  HENT:     Head: Normocephalic.     Nose: Nose normal.  Eyes:     General: No scleral icterus.    Conjunctiva/sclera: Conjunctivae normal.  Neck:  Thyroid: No thyromegaly.  Cardiovascular:     Rate and Rhythm: Normal rate and regular rhythm.     Heart sounds: No murmur heard.   No friction rub. No gallop.  Pulmonary:     Breath sounds: No stridor. No wheezing or rales.  Chest:     Chest wall: No tenderness.  Abdominal:     General: There is no distension.     Tenderness: There is no abdominal tenderness. There is no rebound.  Musculoskeletal:        General: Normal range of motion.     Cervical back: Neck supple.  Lymphadenopathy:     Cervical: No cervical adenopathy.  Skin:    Findings: No erythema or rash.  Neurological:     Mental Status: She is alert and oriented to person, place, and time.     Motor: No  abnormal muscle tone.     Coordination: Coordination normal.  Psychiatric:        Behavior: Behavior normal.    ED Results / Procedures / Treatments   Labs (all labs ordered are listed, but only abnormal results are displayed) Labs Reviewed  COMPREHENSIVE METABOLIC PANEL - Abnormal; Notable for the following components:      Result Value   Glucose, Bld 156 (*)    Calcium 8.7 (*)    Total Bilirubin 0.1 (*)    All other components within normal limits  URINALYSIS, ROUTINE W REFLEX MICROSCOPIC - Abnormal; Notable for the following components:   APPearance HAZY (*)    Glucose, UA 50 (*)    Hgb urine dipstick SMALL (*)    Leukocytes,Ua MODERATE (*)    WBC, UA >50 (*)    Bacteria, UA MANY (*)    All other components within normal limits  CBG MONITORING, ED - Abnormal; Notable for the following components:   Glucose-Capillary 163 (*)    All other components within normal limits  RESP PANEL BY RT-PCR (FLU A&B, COVID) ARPGX2  URINE CULTURE  CBC WITH DIFFERENTIAL/PLATELET    EKG None  Radiology DG Chest 1 View  Result Date: 07/02/2021 CLINICAL DATA:  Altered mental status confusion and weakness EXAM: CHEST  1 VIEW COMPARISON:  Report 12/30/2011 FINDINGS: No focal opacity or pleural effusion. Mild bronchitic changes. Normal cardiac size with aortic atherosclerosis. No pneumothorax. IMPRESSION: Mild bronchitic changes. Electronically Signed   By: Donavan Foil M.D.   On: 07/02/2021 20:04   CT Head Wo Contrast  Result Date: 07/02/2021 CLINICAL DATA:  Dizziness. EXAM: CT HEAD WITHOUT CONTRAST TECHNIQUE: Contiguous axial images were obtained from the base of the skull through the vertex without intravenous contrast. COMPARISON:  None. FINDINGS: Brain: Moderate age-related atrophy and chronic microvascular ischemic changes. There is no acute intracranial hemorrhage. No mass effect or midline shift. No extra-axial fluid collection. Vascular: No hyperdense vessel or unexpected calcification.  Skull: Normal. Negative for fracture or focal lesion. Sinuses/Orbits: No acute finding. Other: None IMPRESSION: 1. No acute intracranial pathology. 2. Moderate age-related atrophy and chronic microvascular ischemic changes. Electronically Signed   By: Anner Crete M.D.   On: 07/02/2021 20:12    Procedures Procedures    Medications Ordered in ED Medications  ciprofloxacin (CIPRO) tablet 500 mg (has no administration in time range)    ED Course/ Medical Decision Making/ A&P                           Medical Decision Making Patient with moderate confusion.  Patient  has dementia and a urinary tract infection she will be sent home with Bactrim and family will follow her closely and she will follow-up with her PCP    This patient presents to the ED for concern of confusion this involves an extensive number of treatment options, and is a complaint that carries with it a high risk of complications and morbidity.  The differential diagnosis includes sepsis, stroke, UTI   Co morbidities that complicate the patient evaluation  Diabetes hypertension coronary disease and dementia   Additional history obtained:  Additional history obtained from patient and family External records from outside source obtained and reviewed including hospital record   Lab Tests:  I Ordered, and personally interpreted labs.  The pertinent results include: Urine analysis that shows many bacteria 11-20 red cells and greater than 50 white cells   Imaging Studies ordered:  I ordered imaging studies including CT scan that shows brain atrophy and a chest x-ray that shows some bronchitis I independently visualized and interpreted imaging which showed atrophy in the brain bronchitis on chest x-ray I agree with the radiologist interpretation   Cardiac Monitoring:  The patient was maintained on a cardiac monitor.  I personally viewed and interpreted the cardiac monitored which showed an underlying rhythm of:  Normal sinus rhythm   Medicines ordered and prescription drug management:  I ordered medication including Cipro for UTI Reevaluation of the patient after these medicines showed that the patient stayed the same I have reviewed the patients home medicines and have made adjustments as needed   Test Considered:  MRI brain done   Critical Interventions:  None   Consultations Obtained:  No consult Problem List / ED Course:  Urinary tract infection   Reevaluation:  After the interventions noted above, I reevaluated the patient and found that they have :stayed the same   Social Determinants of Health:  Lives alone at home   Dispostion:  After consideration of the diagnostic results and the patients response to treatment, I feel that the patent would benefit from discharge home with Bactrim and family watching her.          Final Clinical Impression(s) / ED Diagnoses Final diagnoses:  Acute cystitis with hematuria    Rx / DC Orders ED Discharge Orders          Ordered    sulfamethoxazole-trimethoprim (BACTRIM DS) 800-160 MG tablet  2 times daily        07/02/21 2120              Milton Ferguson, MD 07/02/21 2128

## 2021-07-03 ENCOUNTER — Telehealth: Payer: Self-pay

## 2021-07-03 NOTE — Telephone Encounter (Signed)
RNCM made consult follow up call regarding: [facility] consult for [facility] services.  Spoke with [facility] who reviewed and may be able to accept patient if patient and family agree. Spoke with patient's daughter Falyn Rubel (who is a Engineer, civil (consulting)) who advised the family did not request HHC services they are looking to place patient in a facility. [name] has visited some SNFs and has completed FL2, however patient is not placed yet. The only other option is a Comptroller in the home, and [name] is not sure patient will allow anyone in the home. This RNCM advised that may be an oop expense for a sitter.  This RNCM educated [name] that PCP will need to assist with the process as well. RNCM sent referral to [facility] to assist patient and family with placement options. No additional TOC needs/ request at this time.

## 2021-07-04 ENCOUNTER — Telehealth: Payer: Self-pay

## 2021-07-04 NOTE — Telephone Encounter (Signed)
ED RNCM spoke with Judeth Cornfield with  [facility] to follow up on referral. Judeth Cornfield has spoke with patient's daughter [name] who request a follow up on 07/06/2021 from [place].  No additional TOC needs

## 2021-07-05 LAB — URINE CULTURE: Culture: >=100000 — AB

## 2021-07-06 ENCOUNTER — Telehealth: Payer: Self-pay | Admitting: *Deleted

## 2021-07-06 NOTE — Telephone Encounter (Signed)
Post ED Visit - Positive Culture Follow-up  Culture report reviewed by antimicrobial stewardship pharmacist: [place] Team []  Elenor Quinones, Pharm.D. [x]  Heide Guile, Pharm.D., BCPS AQ-ID []  Parks Neptune, Pharm.D., BCPS []  Alycia Rossetti, Pharm.D., BCPS []  [place], Pharm.D., BCPS, AAHIVP []  Legrand Como, Pharm.D., BCPS, AAHIVP []  Salome Arnt, PharmD, BCPS []  Johnnette Gourd, PharmD, BCPS []  Hughes Better, PharmD, BCPS []  Leeroy Cha, PharmD []  Laqueta Linden, PharmD, BCPS []  Albertina Parr, PharmD  [place] Team []  Leodis Sias, PharmD []  Lindell Spar, PharmD []  Royetta Asal, PharmD []  Graylin Shiver, Rph []  Rema Fendt) Glennon Mac, PharmD []  Arlyn Dunning, PharmD []  Netta Cedars, PharmD []  Dia Sitter, PharmD []  Leone Haven, PharmD []  Gretta Arab, PharmD []  Theodis Shove, PharmD []  Peggyann Juba, PharmD []  Reuel Boom, PharmD   Positive urine culture Treated with Sulfamethoxazole Trimethoprim, organism sensitive to the same and no further patient follow-up is required at this time.  Harlon Flor [facility] 07/06/2021, 10:55 AM

## 2021-07-06 NOTE — Telephone Encounter (Signed)
Post ED Visit - Positive Culture Follow-up  Culture report reviewed by antimicrobial stewardship pharmacist: [place] Team []  Elenor Quinones, Pharm.D. [x]  Heide Guile, Pharm.D., BCPS AQ-ID []  Parks Neptune, Pharm.D., BCPS []  Alycia Rossetti, Pharm.D., BCPS []  [place], Pharm.D., BCPS, AAHIVP []  Legrand Como, Pharm.D., BCPS, AAHIVP []  Salome Arnt, PharmD, BCPS []  Johnnette Gourd, PharmD, BCPS []  Hughes Better, PharmD, BCPS []  Leeroy Cha, PharmD []  Laqueta Linden, PharmD, BCPS []  Albertina Parr, PharmD  [place] Team []  Leodis Sias, PharmD []  Lindell Spar, PharmD []  Royetta Asal, PharmD []  Graylin Shiver, Rph []  Rema Fendt) Glennon Mac, PharmD []  Arlyn Dunning, PharmD []  Netta Cedars, PharmD []  Dia Sitter, PharmD []  Leone Haven, PharmD []  Gretta Arab, PharmD []  Theodis Shove, PharmD []  Peggyann Juba, PharmD []  Reuel Boom, PharmD   Positive urine culture Treated with Sulfamethoxazole-Trimethoprim, organism sensitive to the same and no further patient follow-up is required at this time.  Harlon Flor [facility] 07/06/2021, 10:52 AM

## 2021-07-11 ENCOUNTER — Telehealth: Payer: Self-pay | Admitting: Family

## 2021-07-13 ENCOUNTER — Other Ambulatory Visit: Payer: Self-pay | Admitting: Family

## 2021-07-13 NOTE — Telephone Encounter (Signed)
TC to [place] verified medications they will be taking with her She would like FL2 to also be sent to [facility]

## 2021-07-14 ENCOUNTER — Ambulatory Visit (INDEPENDENT_AMBULATORY_CARE_PROVIDER_SITE_OTHER): Payer: Medicare Other | Admitting: Licensed Clinical Social Worker

## 2021-07-14 DIAGNOSIS — Z8679 Personal history of other diseases of the circulatory system: Secondary | ICD-10-CM

## 2021-07-14 DIAGNOSIS — E559 Vitamin D deficiency, unspecified: Secondary | ICD-10-CM

## 2021-07-14 DIAGNOSIS — I10 Essential (primary) hypertension: Secondary | ICD-10-CM

## 2021-07-14 DIAGNOSIS — F03B18 Unspecified dementia, moderate, with other behavioral disturbance: Secondary | ICD-10-CM

## 2021-07-14 DIAGNOSIS — E119 Type 2 diabetes mellitus without complications: Secondary | ICD-10-CM

## 2021-07-14 NOTE — Chronic Care Management (AMB) (Signed)
Chronic Care Management    Clinical Social Work Note  07/14/2021 Name: Julie Riley MRN: [id number] DOB: 19-May-1932  Julie Riley is a 88 y.o. year old female who is a primary care patient of Julie Spencer, FNP. The CCM team was consulted to assist the patient with chronic disease management and/or care coordination needs related to: [name] .   Engaged with patient /daughter of patient, Julie Riley, by telephone for follow up visit in response to provider referral for social work chronic care management and care coordination services.   Consent to Services:  The patient was given information about Chronic Care Management services, agreed to services, and gave verbal consent prior to initiation of services.  Please see initial visit note for detailed documentation.   Patient agreed to services and consent obtained.   Assessment: Review of patient past medical history, allergies, medications, and health status, including review of relevant consultants reports was performed today as part of a comprehensive evaluation and provision of chronic care management and care coordination services.     SDOH (Social Determinants of Health) assessments and interventions performed:  SDOH Interventions    Flowsheet Row Most Recent Value  SDOH Interventions   Stress Interventions Other (Comment)  [client has stress related to confusion issues. client has stress related to in home care needs]  Depression Interventions/Treatment  Patient refuses Treatment  [informed Julie Riley, daughter of client, about LCSW support for client and about RNCM support for client]        Advanced Directives Status: See [employer] application for related entries.  CCM Care Plan  Allergies  Allergen Reactions   Amoxicillin    Ampicillin Hives   Penicillins    Xarelto [Rivaroxaban] Other (See Comments)    Severe diarrhea    Outpatient Encounter Medications as of 07/14/2021  Medication Sig    aspirin 325 MG EC tablet Take 325 mg by mouth daily.   atorvastatin (LIPITOR) 10 MG tablet Take 1 tablet (10 mg total) by mouth daily. (Patient not taking: Reported on 04/17/2021)   cholecalciferol (VITAMIN D) 25 MCG (1000 UNIT) tablet TAKE 1 CAPSULE BY MOUTH EVERY DAY (Patient not taking: Reported on 04/17/2021)   Cholecalciferol (VITAMIN D3) 50 MCG (2000 UT) capsule TAKE 1 TABLET BY MOUTH EVERY DAY   diltiazem (CARDIZEM) 30 MG tablet Take 1 tablet (30 mg total) by mouth 2 (two) times daily. (Patient not taking: Reported on 06/13/2021)   enalapril (VASOTEC) 20 MG tablet Take 1 tablet (20 mg total) by mouth 2 (two) times daily. (Patient not taking: Reported on 06/13/2021)   glucose blood test strip Use to check blood sugars two times daily (Patient not taking: Reported on 06/13/2021)   meloxicam (MOBIC) 7.5 MG tablet Take 1 tablet (7.5 mg total) by mouth daily. (Patient not taking: Reported on 04/17/2021)   memantine (NAMENDA) 5 MG tablet Take 1 tablet (5 mg total) by mouth 2 (two) times daily. (Patient not taking: Reported on 06/13/2021)   metFORMIN (GLUCOPHAGE) 500 MG tablet TAKE 1 TABLET BY MOUTH DAILY WITH BREAKFAST   ReliOn Lancets Micro-Thin 33G MISC Use to check blood sugars two times daily (Patient not taking: Reported on 06/13/2021)   No facility-administered encounter medications on file as of 07/14/2021.    Patient Active Problem List   Diagnosis Date Noted   Dementia (HCC) 11/16/2019   Memory changes 11/02/2019   Essential hypertension 07/18/2016   History of atrial fibrillation 07/18/2016   Type 2 diabetes mellitus without complication, without long-term  current use of insulin (HCC) 07/18/2016   Neuropathy 07/18/2016   Vitamin D deficiency 07/18/2016   OAB (overactive bladder) 07/18/2016   Pure hypercholesterolemia 07/18/2016   Carotid stenosis     Conditions to be addressed/monitored: monitor the in home care needs of client  Care Plan : LCSW care plan  Updates made by  Isaiah Blakes, LCSW since 07/14/2021 12:00 AM     Problem: Cognitive Function      Goal: Manage memory issues for client. Client to complete ADLs as she is able   Start Date: 07/14/2021  Expected End Date: 10/10/2021  This Visit's Progress: Not on track  Recent Progress: Not on track  Priority: Medium  Note:   Current Barriers:  Memory issues; cognitive decline Mobility issues Difficulty completing ADLs Suicidal Ideation/Homicidal Ideation: No  Clinical Social Work Goal(s):  patient [name] will work with SW in next 30 days by telephone or in person to reduce or manage symptoms related to client cognitive issues Patient/daughter to communicate with LCSW in next 30 days to discuss level of care issues for client and placement options for client Client to communicate regularly in next 30 days with Julie Riley, daughter, about needs of client Client to attend scheduled client medical appointments in next 30 days Client or her daughter, Julie Riley, to communicate as needed in next 30 days with RNCM to discuss nursing needs of client  Interventions: 1:1 collaboration with Julie Spencer, FNP regarding development and update of comprehensive plan of care as evidenced by provider attestation and co-signature Discussed with Julie Riley, daughter of client, the current needs of client Reviewed with Julie Riley client appetite challenges. Julie Riley said that client has reduced appetite. Reviewed with Julie Riley pain issues of client and sleeping issues of client Discussed [facility] for possible client care. . Provided  counseling support for Julie Riley related to client needs Discussed Medicaid application for client with Julie Riley . Discussed financial costs of ALF versus SNF care for client.  Discussed with Julie Riley the process of turning in for client list of [employer] [facility]eds as part of a Medicaid application for client. LCSW also discussed with Julie Riley what a list of [employer] [facility]eds for client might  include Discussed with Julie Riley ADTS support for in  home care for client Discussed food preparation for client.  Julie Riley said client is no longer cooking.  Family has unplugged stove and removed toaster in home of client for safety reasons LCSW encouraged Tecora Eustache to call DSS [facility] and talk with Adult Medicaid Caseworker about financial thresholds or financial limits an applicant for Medicaid must meet to qualify for Medicaid for ALF and SNF level of care.  Encouraged Julie Riley to call RNCM as needed in next 30 days to discuss nursing needs of client.  Patient Self Care Activities:  Attends all scheduled provider appointments  Patient Coping Strengths:  Family Hopefulness  Patient Self Care Deficits:  Mobility issues Memory challenges  Patient Goals:  - spend time or talk with others every day - practice relaxation or meditation daily - keep a calendar with appointment dates  Follow Up Plan: LCSW to call client or daughter of client on 09/08/21 at 10:00 AM to assess client needs at that time.      [name].Drucella Karbowski MSW, LCSW Licensed Visual merchandiser [facility] Care Management [phone]

## 2021-07-14 NOTE — Patient Instructions (Addendum)
Visit Information  Patient Goals:  Protect My Health (Patient). Manage memory issues of client  Timeframe:  Short-Term Goal Priority:  Medium Progress:  Not On Track Start Date:          07/14/21               Expected End Date:        10/10/21             Follow Up Date  09/08/21 at 10:00 AM   Protect My Health (Patient) Manage memory issues of client     Why is this important?   Screening tests can find diseases early when they are easier to treat.  Your doctor or nurse will talk with you about which tests are important for you.  Getting shots for common diseases like the flu and shingles will help prevent them.    Patient Self Care Activities:  Attends all scheduled provider appointments  Patient Coping Strengths:  Family Hopefulness  Patient Self Care Deficits:  Mobility issues Memory challenges  Patient Goals:  - spend time or talk with others every day - practice relaxation or meditation daily - keep a calendar with appointment dates  Follow Up Plan: LCSW to call client  or daughter of client on 09/08/21 at 10:00 AM to assess needs of client  at that time  [name].Reyonna Haack MSW, LCSW Licensed Visual merchandiser [facility] Care Management [phone]

## 2021-07-25 ENCOUNTER — Telehealth: Payer: Self-pay | Admitting: Family

## 2021-07-25 DIAGNOSIS — F03B18 Unspecified dementia, moderate, with other behavioral disturbance: Secondary | ICD-10-CM

## 2021-07-25 DIAGNOSIS — E119 Type 2 diabetes mellitus without complications: Secondary | ICD-10-CM

## 2021-07-25 DIAGNOSIS — I10 Essential (primary) hypertension: Secondary | ICD-10-CM

## 2021-07-25 NOTE — Telephone Encounter (Signed)
Pts daughter called stating that she needs [place] to send pts FL2 to [facility] in [place] and to [facility] in [place].

## 2021-07-26 NOTE — Telephone Encounter (Signed)
[facility] faxes of the FL2 to the [facility] are done

## 2021-08-18 ENCOUNTER — Telehealth: Payer: Self-pay | Admitting: Family

## 2021-08-18 NOTE — Telephone Encounter (Signed)
Copy of FL2 will be at front desk for pick up

## 2021-09-07 ENCOUNTER — Telehealth: Payer: Medicare Other

## 2021-09-07 ENCOUNTER — Telehealth: Payer: Self-pay | Admitting: Licensed Clinical Social Worker

## 2021-09-07 NOTE — Telephone Encounter (Signed)
?  Chronic Care Management  ?  ? Clinical Social Work Note ?  ?09/07/21 ?Name: Julie Riley       MRN: [id number]       DOB: [birth year] ?  ?Julie Riley is a 88 y.o. year old female who is a primary care patient of [name], FNP. The CCM team was consulted to assist the patient with chronic disease management and/or care coordination needs related to: [name] .  ?  ?LCSW not able to speak via phone with client or her daughter, [name], today. LCSW not able to leave phone message for client or [name] today.  ? ?Follow Up Plan:  LCSW to call client or her daughter on 11/01/21 at 1:00 PM ? ?[name].Tilden Broz MSW, LCSW ?Licensed Clinical Social Worker ?[facility] Care Management ?[phone] ?

## 2021-10-01 DIAGNOSIS — F03B0 Unspecified dementia, moderate, without behavioral disturbance, psychotic disturbance, mood disturbance, and anxiety: Secondary | ICD-10-CM | POA: Diagnosis not present

## 2021-10-01 DIAGNOSIS — Z880 Allergy status to penicillin: Secondary | ICD-10-CM | POA: Diagnosis not present

## 2021-10-01 DIAGNOSIS — I10 Essential (primary) hypertension: Secondary | ICD-10-CM | POA: Diagnosis not present

## 2021-10-01 DIAGNOSIS — E119 Type 2 diabetes mellitus without complications: Secondary | ICD-10-CM | POA: Diagnosis not present

## 2021-10-01 DIAGNOSIS — Z602 Problems related to living alone: Secondary | ICD-10-CM | POA: Diagnosis not present

## 2021-10-04 ENCOUNTER — Ambulatory Visit (INDEPENDENT_AMBULATORY_CARE_PROVIDER_SITE_OTHER): Payer: Medicare Other | Admitting: *Deleted

## 2021-10-04 DIAGNOSIS — Z111 Encounter for screening for respiratory tuberculosis: Secondary | ICD-10-CM | POA: Diagnosis not present

## 2021-10-05 NOTE — Progress Notes (Signed)
Tb skin test placed on left forearm and patient tolerated well. We will read tb skin test on Friday. ?

## 2021-10-06 LAB — TB SKIN TEST
Induration: 0 mm
TB Skin Test: NEGATIVE

## 2021-10-13 ENCOUNTER — Other Ambulatory Visit: Payer: Self-pay | Admitting: Family

## 2021-10-16 ENCOUNTER — Ambulatory Visit: Payer: Medicare Other | Admitting: Family

## 2021-10-24 DIAGNOSIS — E785 Hyperlipidemia, unspecified: Secondary | ICD-10-CM | POA: Diagnosis not present

## 2021-10-24 DIAGNOSIS — R609 Edema, unspecified: Secondary | ICD-10-CM | POA: Diagnosis not present

## 2021-10-24 DIAGNOSIS — I10 Essential (primary) hypertension: Secondary | ICD-10-CM | POA: Diagnosis not present

## 2021-10-24 DIAGNOSIS — I4891 Unspecified atrial fibrillation: Secondary | ICD-10-CM | POA: Diagnosis not present

## 2021-10-24 DIAGNOSIS — B351 Tinea unguium: Secondary | ICD-10-CM | POA: Diagnosis not present

## 2021-10-24 DIAGNOSIS — E119 Type 2 diabetes mellitus without complications: Secondary | ICD-10-CM | POA: Diagnosis not present

## 2021-11-01 ENCOUNTER — Telehealth: Payer: Medicare Other

## 2021-11-02 DIAGNOSIS — M79676 Pain in unspecified toe(s): Secondary | ICD-10-CM | POA: Diagnosis not present

## 2021-11-02 DIAGNOSIS — E1142 Type 2 diabetes mellitus with diabetic polyneuropathy: Secondary | ICD-10-CM | POA: Diagnosis not present

## 2021-11-02 DIAGNOSIS — L84 Corns and callosities: Secondary | ICD-10-CM | POA: Diagnosis not present

## 2021-11-02 DIAGNOSIS — B351 Tinea unguium: Secondary | ICD-10-CM | POA: Diagnosis not present

## 2021-11-04 DIAGNOSIS — E785 Hyperlipidemia, unspecified: Secondary | ICD-10-CM | POA: Diagnosis not present

## 2021-11-04 DIAGNOSIS — E119 Type 2 diabetes mellitus without complications: Secondary | ICD-10-CM | POA: Diagnosis not present

## 2021-11-04 DIAGNOSIS — I4891 Unspecified atrial fibrillation: Secondary | ICD-10-CM | POA: Diagnosis not present

## 2021-11-04 DIAGNOSIS — I10 Essential (primary) hypertension: Secondary | ICD-10-CM | POA: Diagnosis not present

## 2021-11-07 ENCOUNTER — Ambulatory Visit: Payer: Medicare Other | Admitting: Family

## 2021-11-19 DIAGNOSIS — I10 Essential (primary) hypertension: Secondary | ICD-10-CM | POA: Diagnosis not present

## 2021-11-21 DIAGNOSIS — I4891 Unspecified atrial fibrillation: Secondary | ICD-10-CM | POA: Diagnosis not present

## 2021-11-21 DIAGNOSIS — E119 Type 2 diabetes mellitus without complications: Secondary | ICD-10-CM | POA: Diagnosis not present

## 2021-11-21 DIAGNOSIS — I10 Essential (primary) hypertension: Secondary | ICD-10-CM | POA: Diagnosis not present

## 2021-11-21 DIAGNOSIS — E559 Vitamin D deficiency, unspecified: Secondary | ICD-10-CM | POA: Diagnosis not present

## 2021-11-30 DIAGNOSIS — F03918 Unspecified dementia, unspecified severity, with other behavioral disturbance: Secondary | ICD-10-CM | POA: Diagnosis not present

## 2021-11-30 DIAGNOSIS — R451 Restlessness and agitation: Secondary | ICD-10-CM | POA: Diagnosis not present

## 2021-12-18 DIAGNOSIS — E559 Vitamin D deficiency, unspecified: Secondary | ICD-10-CM | POA: Diagnosis not present

## 2021-12-18 DIAGNOSIS — I4891 Unspecified atrial fibrillation: Secondary | ICD-10-CM | POA: Diagnosis not present

## 2021-12-18 DIAGNOSIS — E785 Hyperlipidemia, unspecified: Secondary | ICD-10-CM | POA: Diagnosis not present

## 2021-12-18 DIAGNOSIS — I10 Essential (primary) hypertension: Secondary | ICD-10-CM | POA: Diagnosis not present

## 2021-12-18 DIAGNOSIS — E119 Type 2 diabetes mellitus without complications: Secondary | ICD-10-CM | POA: Diagnosis not present

## 2021-12-19 DIAGNOSIS — R609 Edema, unspecified: Secondary | ICD-10-CM | POA: Diagnosis not present

## 2021-12-19 DIAGNOSIS — E559 Vitamin D deficiency, unspecified: Secondary | ICD-10-CM | POA: Diagnosis not present

## 2021-12-19 DIAGNOSIS — E119 Type 2 diabetes mellitus without complications: Secondary | ICD-10-CM | POA: Diagnosis not present

## 2021-12-19 DIAGNOSIS — I10 Essential (primary) hypertension: Secondary | ICD-10-CM | POA: Diagnosis not present

## 2021-12-19 DIAGNOSIS — I4891 Unspecified atrial fibrillation: Secondary | ICD-10-CM | POA: Diagnosis not present

## 2021-12-19 DIAGNOSIS — E785 Hyperlipidemia, unspecified: Secondary | ICD-10-CM | POA: Diagnosis not present

## 2021-12-20 DIAGNOSIS — I10 Essential (primary) hypertension: Secondary | ICD-10-CM | POA: Diagnosis not present

## 2022-01-05 DIAGNOSIS — I4891 Unspecified atrial fibrillation: Secondary | ICD-10-CM | POA: Diagnosis not present

## 2022-01-05 DIAGNOSIS — E559 Vitamin D deficiency, unspecified: Secondary | ICD-10-CM | POA: Diagnosis not present

## 2022-01-05 DIAGNOSIS — E785 Hyperlipidemia, unspecified: Secondary | ICD-10-CM | POA: Diagnosis not present

## 2022-01-05 DIAGNOSIS — I10 Essential (primary) hypertension: Secondary | ICD-10-CM | POA: Diagnosis not present

## 2022-01-05 DIAGNOSIS — E119 Type 2 diabetes mellitus without complications: Secondary | ICD-10-CM | POA: Diagnosis not present

## 2022-01-16 DIAGNOSIS — E785 Hyperlipidemia, unspecified: Secondary | ICD-10-CM | POA: Diagnosis not present

## 2022-01-16 DIAGNOSIS — E119 Type 2 diabetes mellitus without complications: Secondary | ICD-10-CM | POA: Diagnosis not present

## 2022-01-16 DIAGNOSIS — I4891 Unspecified atrial fibrillation: Secondary | ICD-10-CM | POA: Diagnosis not present

## 2022-01-16 DIAGNOSIS — E559 Vitamin D deficiency, unspecified: Secondary | ICD-10-CM | POA: Diagnosis not present

## 2022-01-16 DIAGNOSIS — I10 Essential (primary) hypertension: Secondary | ICD-10-CM | POA: Diagnosis not present

## 2022-01-19 DIAGNOSIS — I10 Essential (primary) hypertension: Secondary | ICD-10-CM | POA: Diagnosis not present

## 2022-01-25 DIAGNOSIS — M79676 Pain in unspecified toe(s): Secondary | ICD-10-CM | POA: Diagnosis not present

## 2022-01-25 DIAGNOSIS — L84 Corns and callosities: Secondary | ICD-10-CM | POA: Diagnosis not present

## 2022-01-25 DIAGNOSIS — B351 Tinea unguium: Secondary | ICD-10-CM | POA: Diagnosis not present

## 2022-01-25 DIAGNOSIS — E1142 Type 2 diabetes mellitus with diabetic polyneuropathy: Secondary | ICD-10-CM | POA: Diagnosis not present

## 2022-01-26 DIAGNOSIS — R011 Cardiac murmur, unspecified: Secondary | ICD-10-CM | POA: Diagnosis not present

## 2022-01-30 DIAGNOSIS — I6523 Occlusion and stenosis of bilateral carotid arteries: Secondary | ICD-10-CM | POA: Diagnosis not present

## 2022-01-30 DIAGNOSIS — R0989 Other specified symptoms and signs involving the circulatory and respiratory systems: Secondary | ICD-10-CM | POA: Diagnosis not present

## 2022-01-31 DIAGNOSIS — E119 Type 2 diabetes mellitus without complications: Secondary | ICD-10-CM | POA: Diagnosis not present

## 2022-01-31 DIAGNOSIS — I4891 Unspecified atrial fibrillation: Secondary | ICD-10-CM | POA: Diagnosis not present

## 2022-01-31 DIAGNOSIS — I10 Essential (primary) hypertension: Secondary | ICD-10-CM | POA: Diagnosis not present

## 2022-01-31 DIAGNOSIS — E785 Hyperlipidemia, unspecified: Secondary | ICD-10-CM | POA: Diagnosis not present

## 2022-01-31 DIAGNOSIS — E559 Vitamin D deficiency, unspecified: Secondary | ICD-10-CM | POA: Diagnosis not present

## 2022-02-19 DIAGNOSIS — I10 Essential (primary) hypertension: Secondary | ICD-10-CM | POA: Diagnosis not present

## 2022-02-20 DIAGNOSIS — I4891 Unspecified atrial fibrillation: Secondary | ICD-10-CM | POA: Diagnosis not present

## 2022-02-20 DIAGNOSIS — E119 Type 2 diabetes mellitus without complications: Secondary | ICD-10-CM | POA: Diagnosis not present

## 2022-02-20 DIAGNOSIS — E559 Vitamin D deficiency, unspecified: Secondary | ICD-10-CM | POA: Diagnosis not present

## 2022-02-20 DIAGNOSIS — I10 Essential (primary) hypertension: Secondary | ICD-10-CM | POA: Diagnosis not present

## 2022-02-20 DIAGNOSIS — E785 Hyperlipidemia, unspecified: Secondary | ICD-10-CM | POA: Diagnosis not present

## 2022-02-20 DIAGNOSIS — I77811 Abdominal aortic ectasia: Secondary | ICD-10-CM | POA: Diagnosis not present

## 2022-03-01 DIAGNOSIS — I10 Essential (primary) hypertension: Secondary | ICD-10-CM | POA: Diagnosis not present

## 2022-03-01 DIAGNOSIS — E038 Other specified hypothyroidism: Secondary | ICD-10-CM | POA: Diagnosis not present

## 2022-03-01 DIAGNOSIS — E119 Type 2 diabetes mellitus without complications: Secondary | ICD-10-CM | POA: Diagnosis not present

## 2022-03-01 DIAGNOSIS — I4891 Unspecified atrial fibrillation: Secondary | ICD-10-CM | POA: Diagnosis not present

## 2022-03-01 DIAGNOSIS — E782 Mixed hyperlipidemia: Secondary | ICD-10-CM | POA: Diagnosis not present

## 2022-03-01 DIAGNOSIS — E785 Hyperlipidemia, unspecified: Secondary | ICD-10-CM | POA: Diagnosis not present

## 2022-03-01 DIAGNOSIS — E559 Vitamin D deficiency, unspecified: Secondary | ICD-10-CM | POA: Diagnosis not present

## 2022-03-01 DIAGNOSIS — D518 Other vitamin B12 deficiency anemias: Secondary | ICD-10-CM | POA: Diagnosis not present

## 2022-03-02 DIAGNOSIS — I70223 Atherosclerosis of native arteries of extremities with rest pain, bilateral legs: Secondary | ICD-10-CM | POA: Diagnosis not present

## 2022-03-05 DIAGNOSIS — E038 Other specified hypothyroidism: Secondary | ICD-10-CM | POA: Diagnosis not present

## 2022-03-05 DIAGNOSIS — E119 Type 2 diabetes mellitus without complications: Secondary | ICD-10-CM | POA: Diagnosis not present

## 2022-03-05 DIAGNOSIS — E782 Mixed hyperlipidemia: Secondary | ICD-10-CM | POA: Diagnosis not present

## 2022-03-05 DIAGNOSIS — E559 Vitamin D deficiency, unspecified: Secondary | ICD-10-CM | POA: Diagnosis not present

## 2022-03-05 DIAGNOSIS — Z79899 Other long term (current) drug therapy: Secondary | ICD-10-CM | POA: Diagnosis not present

## 2022-03-05 DIAGNOSIS — D518 Other vitamin B12 deficiency anemias: Secondary | ICD-10-CM | POA: Diagnosis not present

## 2022-03-22 DIAGNOSIS — I10 Essential (primary) hypertension: Secondary | ICD-10-CM | POA: Diagnosis not present

## 2022-03-27 DIAGNOSIS — I4891 Unspecified atrial fibrillation: Secondary | ICD-10-CM | POA: Diagnosis not present

## 2022-03-27 DIAGNOSIS — E118 Type 2 diabetes mellitus with unspecified complications: Secondary | ICD-10-CM | POA: Diagnosis not present

## 2022-03-27 DIAGNOSIS — E785 Hyperlipidemia, unspecified: Secondary | ICD-10-CM | POA: Diagnosis not present

## 2022-03-27 DIAGNOSIS — E559 Vitamin D deficiency, unspecified: Secondary | ICD-10-CM | POA: Diagnosis not present

## 2022-04-02 DIAGNOSIS — I4891 Unspecified atrial fibrillation: Secondary | ICD-10-CM | POA: Diagnosis not present

## 2022-04-02 DIAGNOSIS — E785 Hyperlipidemia, unspecified: Secondary | ICD-10-CM | POA: Diagnosis not present

## 2022-04-02 DIAGNOSIS — E119 Type 2 diabetes mellitus without complications: Secondary | ICD-10-CM | POA: Diagnosis not present

## 2022-04-02 DIAGNOSIS — E559 Vitamin D deficiency, unspecified: Secondary | ICD-10-CM | POA: Diagnosis not present

## 2022-04-02 DIAGNOSIS — E782 Mixed hyperlipidemia: Secondary | ICD-10-CM | POA: Diagnosis not present

## 2022-04-02 DIAGNOSIS — E038 Other specified hypothyroidism: Secondary | ICD-10-CM | POA: Diagnosis not present

## 2022-04-02 DIAGNOSIS — I10 Essential (primary) hypertension: Secondary | ICD-10-CM | POA: Diagnosis not present

## 2022-04-02 DIAGNOSIS — D518 Other vitamin B12 deficiency anemias: Secondary | ICD-10-CM | POA: Diagnosis not present

## 2022-04-21 DIAGNOSIS — I10 Essential (primary) hypertension: Secondary | ICD-10-CM | POA: Diagnosis not present

## 2022-05-02 DIAGNOSIS — E119 Type 2 diabetes mellitus without complications: Secondary | ICD-10-CM | POA: Diagnosis not present

## 2022-05-02 DIAGNOSIS — E782 Mixed hyperlipidemia: Secondary | ICD-10-CM | POA: Diagnosis not present

## 2022-05-02 DIAGNOSIS — I10 Essential (primary) hypertension: Secondary | ICD-10-CM | POA: Diagnosis not present

## 2022-05-02 DIAGNOSIS — E785 Hyperlipidemia, unspecified: Secondary | ICD-10-CM | POA: Diagnosis not present

## 2022-05-02 DIAGNOSIS — E559 Vitamin D deficiency, unspecified: Secondary | ICD-10-CM | POA: Diagnosis not present

## 2022-05-02 DIAGNOSIS — D518 Other vitamin B12 deficiency anemias: Secondary | ICD-10-CM | POA: Diagnosis not present

## 2022-05-02 DIAGNOSIS — E038 Other specified hypothyroidism: Secondary | ICD-10-CM | POA: Diagnosis not present

## 2022-05-02 DIAGNOSIS — I4891 Unspecified atrial fibrillation: Secondary | ICD-10-CM | POA: Diagnosis not present

## 2022-05-15 DIAGNOSIS — E785 Hyperlipidemia, unspecified: Secondary | ICD-10-CM | POA: Diagnosis not present

## 2022-05-15 DIAGNOSIS — I10 Essential (primary) hypertension: Secondary | ICD-10-CM | POA: Diagnosis not present

## 2022-05-15 DIAGNOSIS — I4891 Unspecified atrial fibrillation: Secondary | ICD-10-CM | POA: Diagnosis not present

## 2022-05-15 DIAGNOSIS — E119 Type 2 diabetes mellitus without complications: Secondary | ICD-10-CM | POA: Diagnosis not present

## 2022-05-15 DIAGNOSIS — E559 Vitamin D deficiency, unspecified: Secondary | ICD-10-CM | POA: Diagnosis not present

## 2022-05-22 DIAGNOSIS — I10 Essential (primary) hypertension: Secondary | ICD-10-CM | POA: Diagnosis not present

## 2022-05-30 DIAGNOSIS — M25532 Pain in left wrist: Secondary | ICD-10-CM | POA: Diagnosis not present

## 2022-05-30 DIAGNOSIS — M79642 Pain in left hand: Secondary | ICD-10-CM | POA: Diagnosis not present

## 2022-06-08 DIAGNOSIS — M79644 Pain in right finger(s): Secondary | ICD-10-CM | POA: Diagnosis not present

## 2022-06-12 DIAGNOSIS — E785 Hyperlipidemia, unspecified: Secondary | ICD-10-CM | POA: Diagnosis not present

## 2022-06-12 DIAGNOSIS — I10 Essential (primary) hypertension: Secondary | ICD-10-CM | POA: Diagnosis not present

## 2022-06-12 DIAGNOSIS — E559 Vitamin D deficiency, unspecified: Secondary | ICD-10-CM | POA: Diagnosis not present

## 2022-06-12 DIAGNOSIS — R609 Edema, unspecified: Secondary | ICD-10-CM | POA: Diagnosis not present

## 2022-06-12 DIAGNOSIS — I4891 Unspecified atrial fibrillation: Secondary | ICD-10-CM | POA: Diagnosis not present

## 2022-06-14 DIAGNOSIS — E782 Mixed hyperlipidemia: Secondary | ICD-10-CM | POA: Diagnosis not present

## 2022-06-14 DIAGNOSIS — E038 Other specified hypothyroidism: Secondary | ICD-10-CM | POA: Diagnosis not present

## 2022-06-14 DIAGNOSIS — D518 Other vitamin B12 deficiency anemias: Secondary | ICD-10-CM | POA: Diagnosis not present

## 2022-06-14 DIAGNOSIS — E119 Type 2 diabetes mellitus without complications: Secondary | ICD-10-CM | POA: Diagnosis not present

## 2022-06-14 DIAGNOSIS — Z79899 Other long term (current) drug therapy: Secondary | ICD-10-CM | POA: Diagnosis not present

## 2022-06-19 DIAGNOSIS — I4891 Unspecified atrial fibrillation: Secondary | ICD-10-CM | POA: Diagnosis not present

## 2022-06-19 DIAGNOSIS — E559 Vitamin D deficiency, unspecified: Secondary | ICD-10-CM | POA: Diagnosis not present

## 2022-06-19 DIAGNOSIS — E038 Other specified hypothyroidism: Secondary | ICD-10-CM | POA: Diagnosis not present

## 2022-06-19 DIAGNOSIS — D518 Other vitamin B12 deficiency anemias: Secondary | ICD-10-CM | POA: Diagnosis not present

## 2022-06-19 DIAGNOSIS — E782 Mixed hyperlipidemia: Secondary | ICD-10-CM | POA: Diagnosis not present

## 2022-06-19 DIAGNOSIS — I10 Essential (primary) hypertension: Secondary | ICD-10-CM | POA: Diagnosis not present

## 2022-06-19 DIAGNOSIS — E119 Type 2 diabetes mellitus without complications: Secondary | ICD-10-CM | POA: Diagnosis not present

## 2022-06-19 DIAGNOSIS — E785 Hyperlipidemia, unspecified: Secondary | ICD-10-CM | POA: Diagnosis not present

## 2022-06-21 DIAGNOSIS — I10 Essential (primary) hypertension: Secondary | ICD-10-CM | POA: Diagnosis not present

## 2022-07-05 DIAGNOSIS — F419 Anxiety disorder, unspecified: Secondary | ICD-10-CM | POA: Diagnosis not present

## 2022-07-05 DIAGNOSIS — F03918 Unspecified dementia, unspecified severity, with other behavioral disturbance: Secondary | ICD-10-CM | POA: Diagnosis not present

## 2022-07-05 DIAGNOSIS — R451 Restlessness and agitation: Secondary | ICD-10-CM | POA: Diagnosis not present

## 2022-07-16 DIAGNOSIS — E119 Type 2 diabetes mellitus without complications: Secondary | ICD-10-CM | POA: Diagnosis not present

## 2022-07-16 DIAGNOSIS — I4891 Unspecified atrial fibrillation: Secondary | ICD-10-CM | POA: Diagnosis not present

## 2022-07-16 DIAGNOSIS — E785 Hyperlipidemia, unspecified: Secondary | ICD-10-CM | POA: Diagnosis not present

## 2022-07-16 DIAGNOSIS — I10 Essential (primary) hypertension: Secondary | ICD-10-CM | POA: Diagnosis not present

## 2022-07-22 DIAGNOSIS — I10 Essential (primary) hypertension: Secondary | ICD-10-CM | POA: Diagnosis not present

## 2022-08-02 DIAGNOSIS — R451 Restlessness and agitation: Secondary | ICD-10-CM | POA: Diagnosis not present

## 2022-08-02 DIAGNOSIS — F03918 Unspecified dementia, unspecified severity, with other behavioral disturbance: Secondary | ICD-10-CM | POA: Diagnosis not present

## 2022-08-02 DIAGNOSIS — F419 Anxiety disorder, unspecified: Secondary | ICD-10-CM | POA: Diagnosis not present

## 2022-08-14 DIAGNOSIS — E785 Hyperlipidemia, unspecified: Secondary | ICD-10-CM | POA: Diagnosis not present

## 2022-08-14 DIAGNOSIS — I4891 Unspecified atrial fibrillation: Secondary | ICD-10-CM | POA: Diagnosis not present

## 2022-08-14 DIAGNOSIS — E559 Vitamin D deficiency, unspecified: Secondary | ICD-10-CM | POA: Diagnosis not present

## 2022-08-14 DIAGNOSIS — E119 Type 2 diabetes mellitus without complications: Secondary | ICD-10-CM | POA: Diagnosis not present

## 2022-08-23 ENCOUNTER — Other Ambulatory Visit: Payer: Self-pay | Admitting: *Deleted

## 2022-08-23 DIAGNOSIS — I10 Essential (primary) hypertension: Secondary | ICD-10-CM

## 2022-08-23 DIAGNOSIS — E7800 Pure hypercholesterolemia, unspecified: Secondary | ICD-10-CM

## 2022-08-27 DIAGNOSIS — F03918 Unspecified dementia, unspecified severity, with other behavioral disturbance: Secondary | ICD-10-CM | POA: Diagnosis not present

## 2022-08-27 DIAGNOSIS — F419 Anxiety disorder, unspecified: Secondary | ICD-10-CM | POA: Diagnosis not present

## 2022-08-27 DIAGNOSIS — R451 Restlessness and agitation: Secondary | ICD-10-CM | POA: Diagnosis not present

## 2022-08-30 DIAGNOSIS — E119 Type 2 diabetes mellitus without complications: Secondary | ICD-10-CM | POA: Diagnosis not present

## 2022-08-30 DIAGNOSIS — E782 Mixed hyperlipidemia: Secondary | ICD-10-CM | POA: Diagnosis not present

## 2022-08-30 DIAGNOSIS — D518 Other vitamin B12 deficiency anemias: Secondary | ICD-10-CM | POA: Diagnosis not present

## 2022-08-30 DIAGNOSIS — Z79899 Other long term (current) drug therapy: Secondary | ICD-10-CM | POA: Diagnosis not present

## 2022-09-10 DIAGNOSIS — I10 Essential (primary) hypertension: Secondary | ICD-10-CM | POA: Diagnosis not present

## 2022-09-10 DIAGNOSIS — E785 Hyperlipidemia, unspecified: Secondary | ICD-10-CM | POA: Diagnosis not present

## 2022-09-10 DIAGNOSIS — F0390 Unspecified dementia without behavioral disturbance: Secondary | ICD-10-CM | POA: Diagnosis not present

## 2022-09-10 DIAGNOSIS — E119 Type 2 diabetes mellitus without complications: Secondary | ICD-10-CM | POA: Diagnosis not present

## 2022-10-02 DIAGNOSIS — E119 Type 2 diabetes mellitus without complications: Secondary | ICD-10-CM | POA: Diagnosis not present

## 2022-10-02 DIAGNOSIS — I4891 Unspecified atrial fibrillation: Secondary | ICD-10-CM | POA: Diagnosis not present

## 2022-10-02 DIAGNOSIS — E785 Hyperlipidemia, unspecified: Secondary | ICD-10-CM | POA: Diagnosis not present

## 2022-10-02 DIAGNOSIS — I10 Essential (primary) hypertension: Secondary | ICD-10-CM | POA: Diagnosis not present

## 2022-10-04 DIAGNOSIS — F03918 Unspecified dementia, unspecified severity, with other behavioral disturbance: Secondary | ICD-10-CM | POA: Diagnosis not present

## 2022-10-04 DIAGNOSIS — F419 Anxiety disorder, unspecified: Secondary | ICD-10-CM | POA: Diagnosis not present

## 2022-10-08 DIAGNOSIS — F0390 Unspecified dementia without behavioral disturbance: Secondary | ICD-10-CM | POA: Diagnosis not present

## 2022-10-08 DIAGNOSIS — E119 Type 2 diabetes mellitus without complications: Secondary | ICD-10-CM | POA: Diagnosis not present

## 2022-10-08 DIAGNOSIS — E785 Hyperlipidemia, unspecified: Secondary | ICD-10-CM | POA: Diagnosis not present

## 2022-10-08 DIAGNOSIS — I10 Essential (primary) hypertension: Secondary | ICD-10-CM | POA: Diagnosis not present

## 2022-10-22 DIAGNOSIS — I10 Essential (primary) hypertension: Secondary | ICD-10-CM | POA: Diagnosis not present

## 2022-10-30 DIAGNOSIS — E119 Type 2 diabetes mellitus without complications: Secondary | ICD-10-CM | POA: Diagnosis not present

## 2022-10-30 DIAGNOSIS — I4891 Unspecified atrial fibrillation: Secondary | ICD-10-CM | POA: Diagnosis not present

## 2022-10-30 DIAGNOSIS — E785 Hyperlipidemia, unspecified: Secondary | ICD-10-CM | POA: Diagnosis not present

## 2022-10-30 DIAGNOSIS — I10 Essential (primary) hypertension: Secondary | ICD-10-CM | POA: Diagnosis not present

## 2022-11-01 DIAGNOSIS — F03918 Unspecified dementia, unspecified severity, with other behavioral disturbance: Secondary | ICD-10-CM | POA: Diagnosis not present

## 2022-11-01 DIAGNOSIS — F419 Anxiety disorder, unspecified: Secondary | ICD-10-CM | POA: Diagnosis not present

## 2022-11-06 DIAGNOSIS — R011 Cardiac murmur, unspecified: Secondary | ICD-10-CM | POA: Diagnosis not present

## 2022-11-07 DIAGNOSIS — Z7901 Long term (current) use of anticoagulants: Secondary | ICD-10-CM | POA: Diagnosis not present

## 2022-11-07 DIAGNOSIS — M7989 Other specified soft tissue disorders: Secondary | ICD-10-CM | POA: Diagnosis not present

## 2022-11-07 DIAGNOSIS — E119 Type 2 diabetes mellitus without complications: Secondary | ICD-10-CM | POA: Diagnosis not present

## 2022-11-07 DIAGNOSIS — D62 Acute posthemorrhagic anemia: Secondary | ICD-10-CM | POA: Diagnosis not present

## 2022-11-07 DIAGNOSIS — R509 Fever, unspecified: Secondary | ICD-10-CM | POA: Diagnosis not present

## 2022-11-07 DIAGNOSIS — R5082 Postprocedural fever: Secondary | ICD-10-CM | POA: Diagnosis not present

## 2022-11-07 DIAGNOSIS — E1121 Type 2 diabetes mellitus with diabetic nephropathy: Secondary | ICD-10-CM | POA: Diagnosis not present

## 2022-11-07 DIAGNOSIS — S7291XA Unspecified fracture of right femur, initial encounter for closed fracture: Secondary | ICD-10-CM | POA: Diagnosis not present

## 2022-11-07 DIAGNOSIS — L89152 Pressure ulcer of sacral region, stage 2: Secondary | ICD-10-CM | POA: Diagnosis not present

## 2022-11-07 DIAGNOSIS — S79929A Unspecified injury of unspecified thigh, initial encounter: Secondary | ICD-10-CM | POA: Diagnosis not present

## 2022-11-07 DIAGNOSIS — R54 Age-related physical debility: Secondary | ICD-10-CM | POA: Diagnosis not present

## 2022-11-07 DIAGNOSIS — K5900 Constipation, unspecified: Secondary | ICD-10-CM | POA: Diagnosis not present

## 2022-11-07 DIAGNOSIS — J209 Acute bronchitis, unspecified: Secondary | ICD-10-CM | POA: Diagnosis not present

## 2022-11-07 DIAGNOSIS — Z471 Aftercare following joint replacement surgery: Secondary | ICD-10-CM | POA: Diagnosis not present

## 2022-11-07 DIAGNOSIS — Z66 Do not resuscitate: Secondary | ICD-10-CM | POA: Diagnosis not present

## 2022-11-07 DIAGNOSIS — S72011A Unspecified intracapsular fracture of right femur, initial encounter for closed fracture: Secondary | ICD-10-CM | POA: Diagnosis not present

## 2022-11-07 DIAGNOSIS — S72001D Fracture of unspecified part of neck of right femur, subsequent encounter for closed fracture with routine healing: Secondary | ICD-10-CM | POA: Diagnosis not present

## 2022-11-07 DIAGNOSIS — M25551 Pain in right hip: Secondary | ICD-10-CM | POA: Diagnosis not present

## 2022-11-07 DIAGNOSIS — F02C0 Dementia in other diseases classified elsewhere, severe, without behavioral disturbance, psychotic disturbance, mood disturbance, and anxiety: Secondary | ICD-10-CM | POA: Diagnosis not present

## 2022-11-07 DIAGNOSIS — I6529 Occlusion and stenosis of unspecified carotid artery: Secondary | ICD-10-CM | POA: Diagnosis not present

## 2022-11-07 DIAGNOSIS — E876 Hypokalemia: Secondary | ICD-10-CM | POA: Diagnosis not present

## 2022-11-07 DIAGNOSIS — Z96641 Presence of right artificial hip joint: Secondary | ICD-10-CM | POA: Diagnosis not present

## 2022-11-07 DIAGNOSIS — S72001A Fracture of unspecified part of neck of right femur, initial encounter for closed fracture: Secondary | ICD-10-CM | POA: Diagnosis not present

## 2022-11-07 DIAGNOSIS — W19XXXA Unspecified fall, initial encounter: Secondary | ICD-10-CM | POA: Diagnosis not present

## 2022-11-07 DIAGNOSIS — L89151 Pressure ulcer of sacral region, stage 1: Secondary | ICD-10-CM | POA: Diagnosis not present

## 2022-11-07 DIAGNOSIS — F0390 Unspecified dementia without behavioral disturbance: Secondary | ICD-10-CM | POA: Diagnosis not present

## 2022-11-07 DIAGNOSIS — G309 Alzheimer's disease, unspecified: Secondary | ICD-10-CM | POA: Diagnosis not present

## 2022-11-07 DIAGNOSIS — E785 Hyperlipidemia, unspecified: Secondary | ICD-10-CM | POA: Diagnosis not present

## 2022-11-07 DIAGNOSIS — I351 Nonrheumatic aortic (valve) insufficiency: Secondary | ICD-10-CM | POA: Diagnosis not present

## 2022-11-07 DIAGNOSIS — I10 Essential (primary) hypertension: Secondary | ICD-10-CM | POA: Diagnosis not present

## 2022-11-07 DIAGNOSIS — E1140 Type 2 diabetes mellitus with diabetic neuropathy, unspecified: Secondary | ICD-10-CM | POA: Diagnosis not present

## 2022-11-07 DIAGNOSIS — I4891 Unspecified atrial fibrillation: Secondary | ICD-10-CM | POA: Diagnosis not present

## 2022-11-27 DIAGNOSIS — E785 Hyperlipidemia, unspecified: Secondary | ICD-10-CM | POA: Diagnosis not present

## 2022-11-27 DIAGNOSIS — I10 Essential (primary) hypertension: Secondary | ICD-10-CM | POA: Diagnosis not present

## 2022-11-27 DIAGNOSIS — E119 Type 2 diabetes mellitus without complications: Secondary | ICD-10-CM | POA: Diagnosis not present

## 2022-11-27 DIAGNOSIS — I4891 Unspecified atrial fibrillation: Secondary | ICD-10-CM | POA: Diagnosis not present

## 2022-12-07 DIAGNOSIS — E119 Type 2 diabetes mellitus without complications: Secondary | ICD-10-CM | POA: Diagnosis not present

## 2022-12-07 DIAGNOSIS — I10 Essential (primary) hypertension: Secondary | ICD-10-CM | POA: Diagnosis not present

## 2022-12-07 DIAGNOSIS — F0390 Unspecified dementia without behavioral disturbance: Secondary | ICD-10-CM | POA: Diagnosis not present

## 2022-12-07 DIAGNOSIS — E785 Hyperlipidemia, unspecified: Secondary | ICD-10-CM | POA: Diagnosis not present

## 2022-12-24 DEATH — deceased

## 2023-03-14 IMAGING — CT CT HEAD W/O CM
3 series · 15 of 47 positions shown, 18 images · non-contrast
Comparison: None.

CLINICAL DATA: Dizziness.

EXAM:
CT HEAD WITHOUT CONTRAST
TECHNIQUE: Contiguous axial images were obtained from the base of the skull
through the vertex without intravenous contrast.

[Series 2: head w o · axial · 0.51mm/px · z∈[-75,+55]mm · 9 of 32 slices shown, 12 images]
[im 3/32  brain]
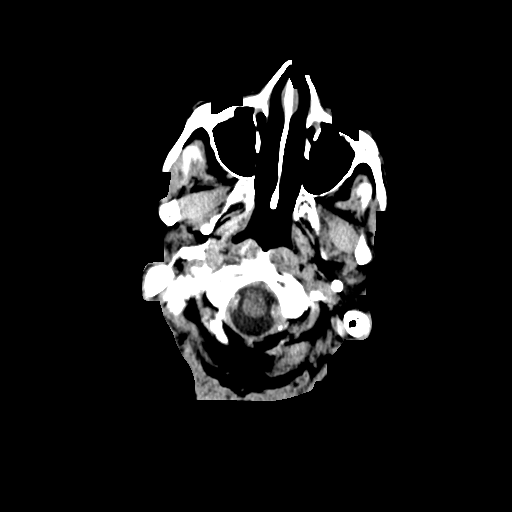
[im 3/32  bone]
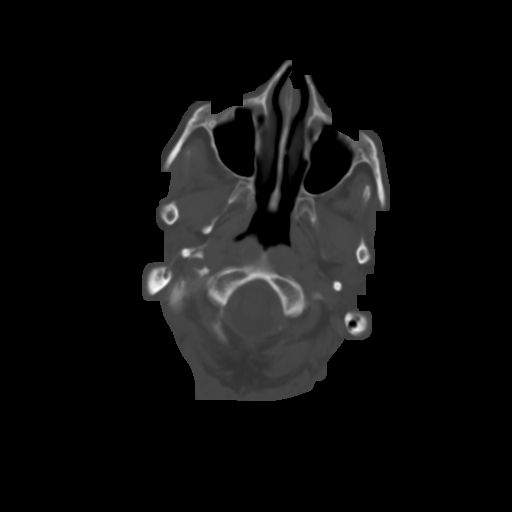
[im 6/32  brain]
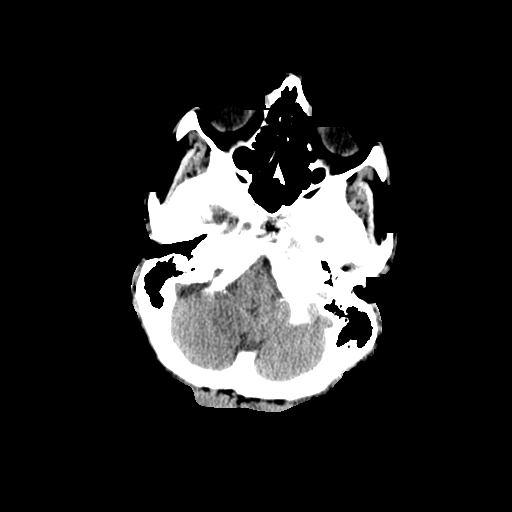
[im 9/32  brain]
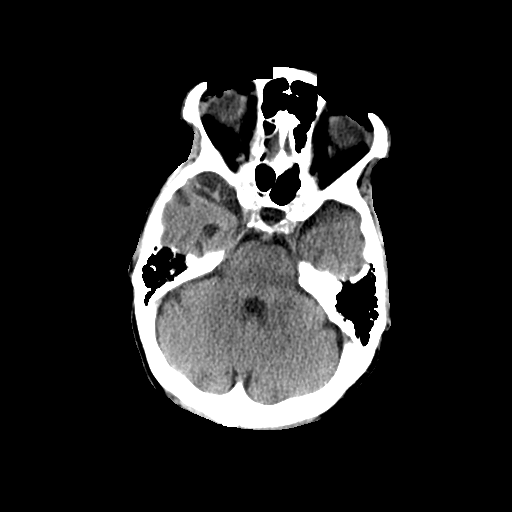
[im 12/32  brain]
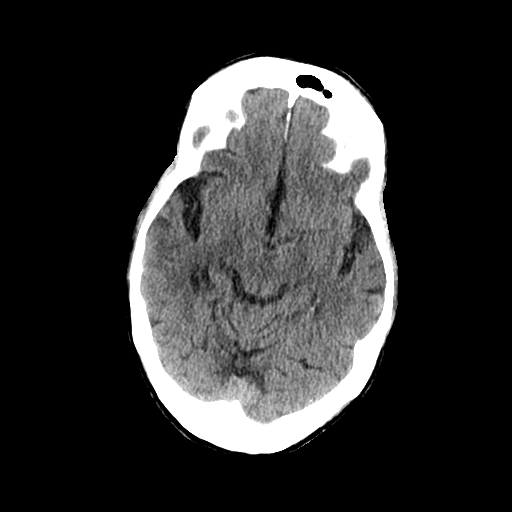
[im 17/32  brain]
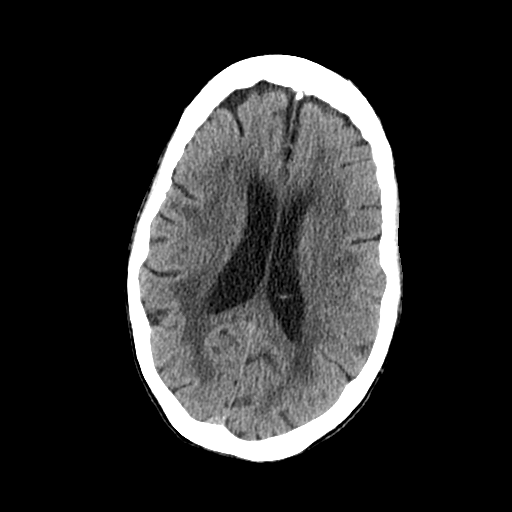
[im 17/32  bone]
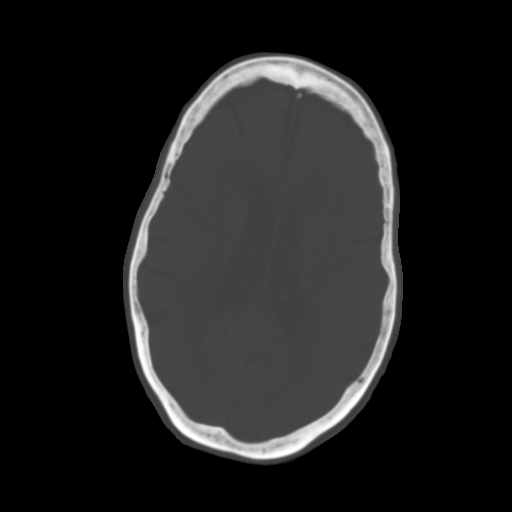
[im 20/32  brain]
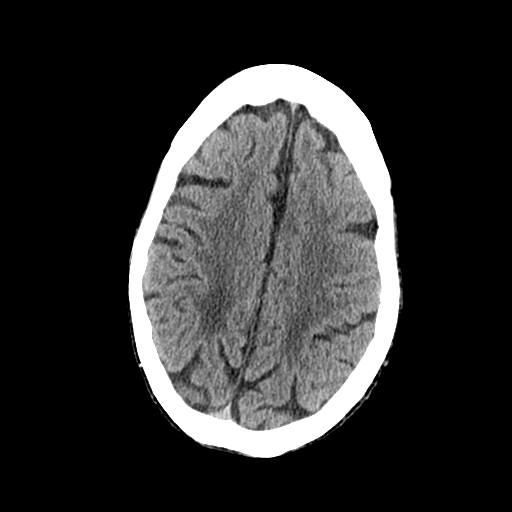
[im 23/32  brain]
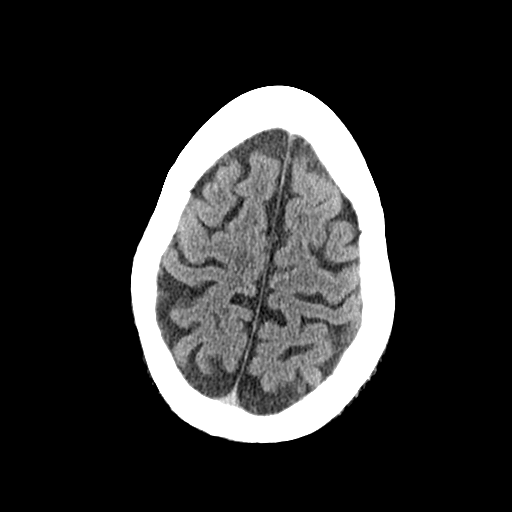
[im 26/32  brain]
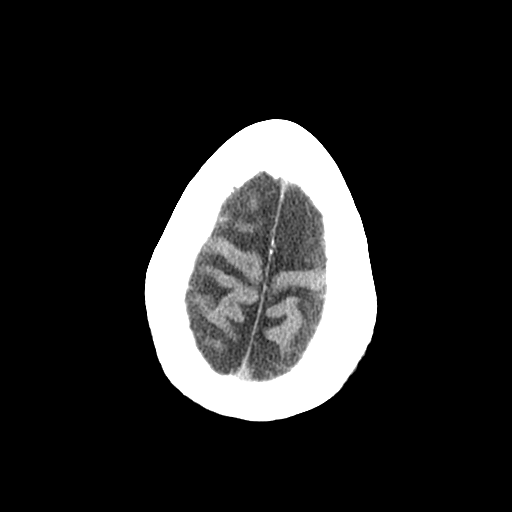
[im 29/32  brain]
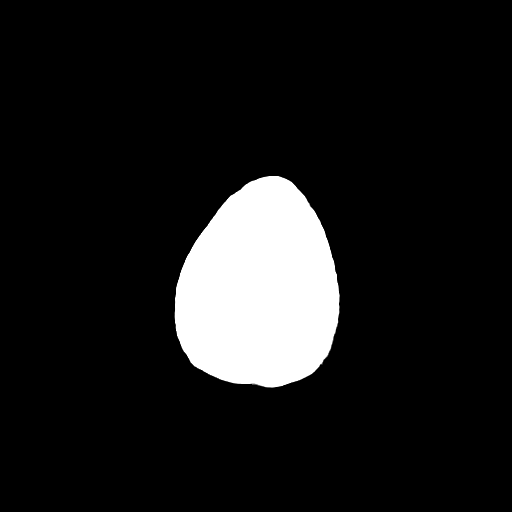
[im 29/32  bone]
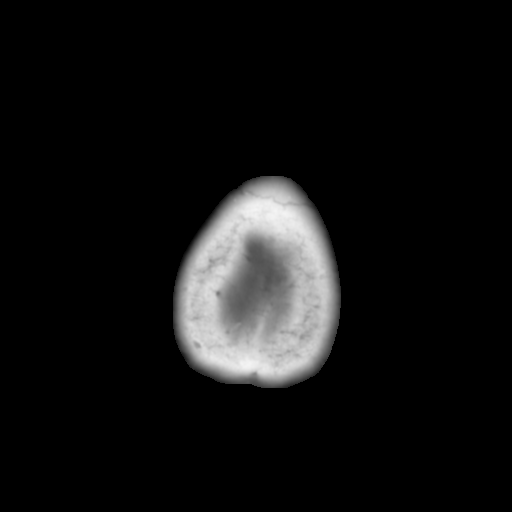

[Series 4: coronal soft · coronal · 0.32mm/px · 3 of 79 slices shown]
[im 27/79  brain]
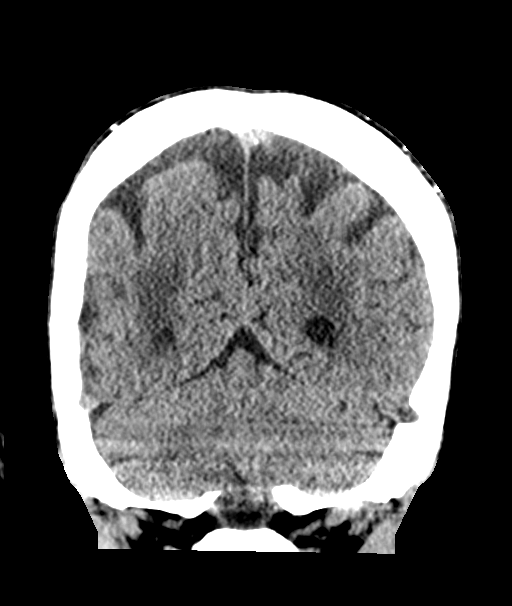
[im 35/79  brain]
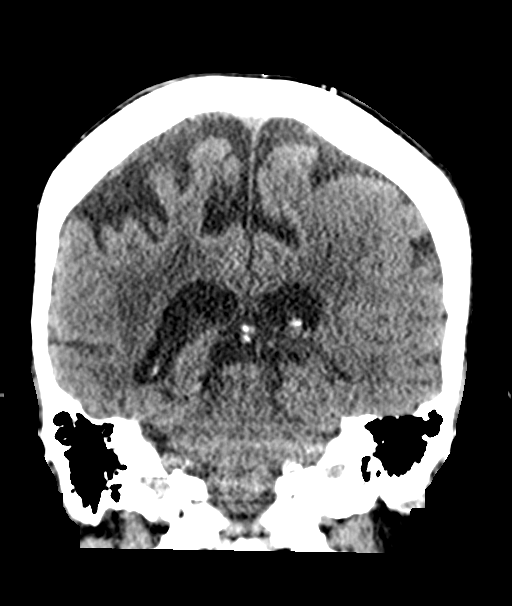
[im 44/79  brain]
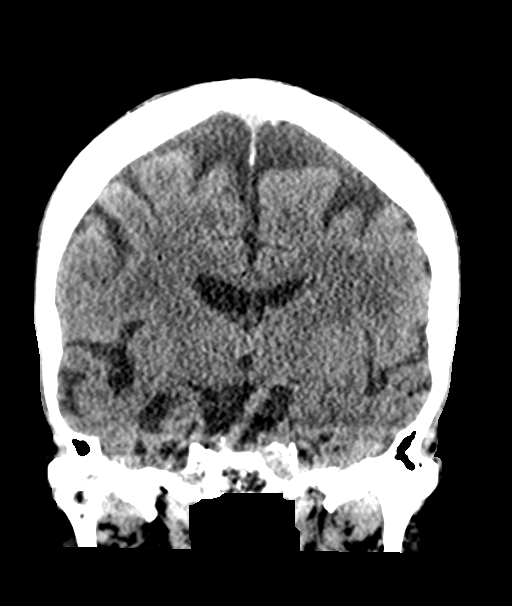

[Series 5: sagittal soft · sagittal · 0.36mm/px · 3 of 54 slices shown]
[im 18/54  brain]
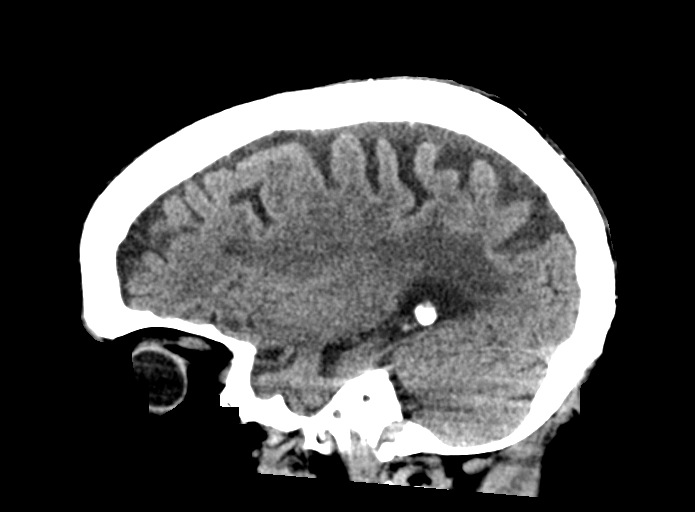
[im 27/54  brain]
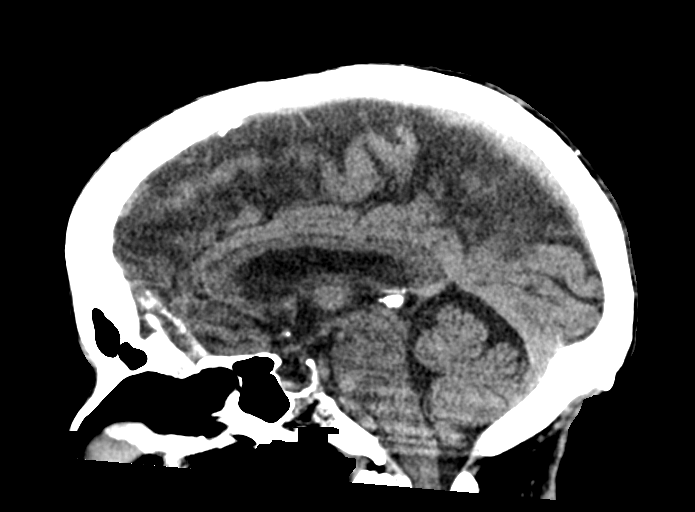
[im 36/54  brain]
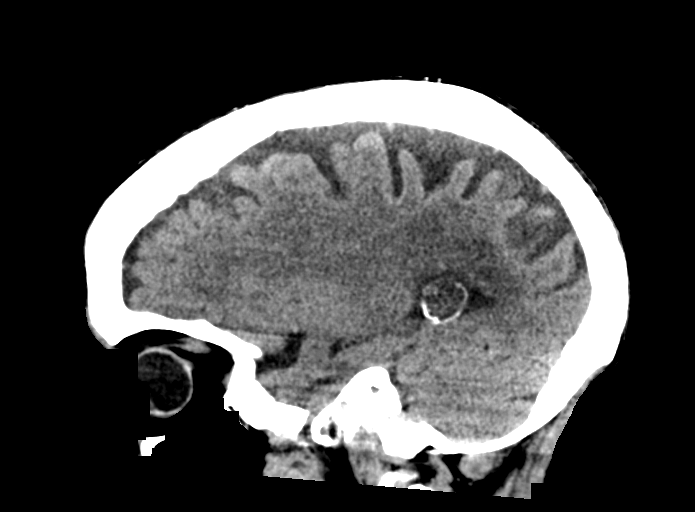

[15 of 47 positions shown; findings below may reference images not displayed]

FINDINGS: Brain: Moderate age-related atrophy and chronic microvascular
ischemic changes. There is no acute intracranial hemorrhage. No mass
effect or midline shift. No extra-axial fluid collection.

Vascular: No hyperdense vessel or unexpected calcification.

Skull: Normal. Negative for fracture or focal lesion.

Sinuses/Orbits: No acute finding.

Other: None
IMPRESSION: 1. No acute intracranial pathology.
2. Moderate age-related atrophy and chronic microvascular ischemic
changes.
